# Patient Record
Sex: Female | Born: 2000 | Race: White | Hispanic: No | Marital: Single | Smoking: Never smoker
Health system: Southern US, Community
[De-identification: ages and names within clinical notes are randomized; demographics above are authoritative.]

## PROBLEM LIST (undated history)

## (undated) DIAGNOSIS — F429 Obsessive-compulsive disorder, unspecified: Secondary | ICD-10-CM

## (undated) DIAGNOSIS — F909 Attention-deficit hyperactivity disorder, unspecified type: Secondary | ICD-10-CM

## (undated) DIAGNOSIS — J45909 Unspecified asthma, uncomplicated: Secondary | ICD-10-CM

## (undated) DIAGNOSIS — M2480 Other specific joint derangements of unspecified joint, not elsewhere classified: Secondary | ICD-10-CM

## (undated) DIAGNOSIS — F5082 Avoidant/restrictive food intake disorder: Secondary | ICD-10-CM

## (undated) DIAGNOSIS — G901 Familial dysautonomia [Riley-Day]: Secondary | ICD-10-CM

## (undated) DIAGNOSIS — F419 Anxiety disorder, unspecified: Secondary | ICD-10-CM

## (undated) HISTORY — DX: Avoidant/restrictive food intake disorder: F50.82

## (undated) HISTORY — DX: Attention-deficit hyperactivity disorder, unspecified type: F90.9

## (undated) HISTORY — DX: Other specific joint derangements of unspecified joint, not elsewhere classified: M24.80

## (undated) HISTORY — DX: Unspecified asthma, uncomplicated: J45.909

## (undated) HISTORY — DX: Familial dysautonomia (riley-day): G90.1

## (undated) HISTORY — PX: WISDOM TOOTH EXTRACTION: SHX21

## (undated) HISTORY — DX: Anxiety disorder, unspecified: F41.9

## (undated) HISTORY — DX: Obsessive-compulsive disorder, unspecified: F42.9

---
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
## 2021-03-17 NOTE — Progress Notes (Deleted)
  [facility]  Telephone:([phone] [phone]) [phone]  ID: Sydney Reyes OB: March 01, 2001  MR#: [id number]  TJQ#:[id number]  Patient Care Team: Cyndia Diver, PA-C as PCP - General (Family Medicine)  CHIEF COMPLAINT: Elevated Ferritin  INTERVAL HISTORY: ***  REVIEW OF SYSTEMS:   ROS  As per HPI. Otherwise, a complete review of systems is negative.  PAST MEDICAL HISTORY: No past medical history on file.  PAST SURGICAL HISTORY: No past surgical history on file.  FAMILY HISTORY: No family history on file.  ADVANCED DIRECTIVES (Y/N):  N  HEALTH MAINTENANCE: Social History   Tobacco Use   Smoking status: Never  Substance Use Topics   Alcohol use: No   Drug use: No     Colonoscopy:  PAP:  Bone density:  Lipid panel:  No Known Allergies  No current outpatient medications on file.   No current facility-administered medications for this visit.    OBJECTIVE: There were no vitals filed for this visit.   There is no height or weight on file to calculate BMI.    ECOG FS:{CHL ONC [id number]  General: Well-developed, well-nourished, no acute distress. Eyes: Pink conjunctiva, anicteric sclera. HEENT: Normocephalic, moist mucous membranes. Lungs: No audible wheezing or coughing. Heart: Regular rate and rhythm. Abdomen: Soft, nontender, no obvious distention. Musculoskeletal: No edema, cyanosis, or clubbing. Neuro: Alert, answering all questions appropriately. Cranial nerves grossly intact. Skin: No rashes or petechiae noted. Psych: Normal affect. Lymphatics: No cervical, calvicular, axillary or inguinal LAD.   LAB RESULTS:  No results found for: NA, K, CL, CO2, GLUCOSE, BUN, CREATININE, CALCIUM, PROT, ALBUMIN, AST, ALT, ALKPHOS, BILITOT, GFRNONAA, GFRAA  No results found for: WBC, NEUTROABS, HGB, HCT, MCV, PLT   STUDIES: No results found.  ASSESSMENT:   PLAN:    Patient expressed understanding and was in agreement with this  plan. She also understands that She can call clinic at any time with any questions, concerns, or complaints.   Cancer Staging No matching staging information was found for the patient.  Mauro Kaufmann, NP   03/17/2021 8:54 PM

## 2021-03-18 ENCOUNTER — Telehealth: Payer: Self-pay | Admitting: Oncology

## 2021-03-18 ENCOUNTER — Inpatient Hospital Stay: Payer: Managed Care, Other (non HMO)

## 2021-03-18 ENCOUNTER — Inpatient Hospital Stay: Payer: Managed Care, Other (non HMO) | Admitting: Oncology

## 2021-03-18 NOTE — Telephone Encounter (Signed)
Nurse was looking over patient chart for New Hematology appt and noticed that the patient tested positive for Covid on 8/12. Call was placed to ask patient not to report to the clinic and to return the call to get her appt rescheduled. Note placed on Appt desk for not to check-in.

## 2021-04-30 ENCOUNTER — Ambulatory Visit: Payer: Managed Care, Other (non HMO) | Admitting: Cardiology

## 2021-04-30 ENCOUNTER — Other Ambulatory Visit: Payer: Self-pay

## 2021-04-30 ENCOUNTER — Encounter: Payer: Self-pay | Admitting: Cardiology

## 2021-04-30 VITALS — BP 127/84 | HR 128 | Resp 16 | Ht 62.0 in | Wt 110.0 lb

## 2021-04-30 DIAGNOSIS — F909 Attention-deficit hyperactivity disorder, unspecified type: Secondary | ICD-10-CM

## 2021-04-30 DIAGNOSIS — R000 Tachycardia, unspecified: Secondary | ICD-10-CM

## 2021-04-30 DIAGNOSIS — F419 Anxiety disorder, unspecified: Secondary | ICD-10-CM

## 2021-04-30 NOTE — Progress Notes (Signed)
Date:  04/30/2021   ID:  Sydney Reyes, DOB 11/30/2000, MRN [id number]  PCP:  Cyndia Diver, PA-C  Cardiologist:  Tessa Lerner, DO, [facility] (established care 04/30/2021)  REASON FOR CONSULT: Evaluation of POTS.   REQUESTING PHYSICIAN:  Tracey Harries, MD [address] [place],  [place] [address]  Chief Complaint  Patient presents with   postural orthostatic tachycardia syndrome   New Patient (Initial Visit)    HPI  Sydney Reyes is a 20 y.o. female who presents to the office with a chief complaint of " evaluation for POTS." Patient's past medical history and cardiovascular risk factors include: ADHD, insomnia, anxiety.  She is referred to the office at the request of Tracey Harries, MD for evaluation of POTS.  Patient is accompanied by her roommate Elmer.  Patient provides verbal consent with regards to discussing her medical information and disease management in the presence of her roommate.  Patient states that approximately 3 years ago or more she had a syncopal event and since then no reoccurrence.  However she does experience lightheaded and dizziness predominantly when changing position.  She also notices variable heart rate ranging from 40-180 bpm.  No unifying diagnoses.  She consumes approximately 32 ounces of water.  Tries to consume 2 or 3 meals per day.  Patient states that at times she does increase salt in the diet when she feels lightheaded and dizzy as per her dietitians recommendation.  She denies the use of cigarette smoking, marijuana, illicit drugs, energy drinks, weight loss supplements.  No known thyroid disease or anemia.  Patient is on stimulant medications for her ADHD.  She was on Adderall in the past which was stopped approximately 6 months ago according to her.  She is now on Viloxazine which was started last week.  She is also on trazodone for sleep and for anxiety she is currently on Pristiq and Xanax.  Patient denies a family  history of premature coronary artery disease, sudden cardiac death, or cardiomyopathy.  ALLERGIES: No Known Allergies  MEDICATION LIST PRIOR TO VISIT: Current Meds  Medication Sig   albuterol (VENTOLIN HFA) 108 (90 Base) MCG/ACT inhaler SMARTSIG:2 Puff(s) By Mouth Every 6 Hours PRN   ALPRAZolam (XANAX) 0.5 MG tablet Take 0.5 mg by mouth daily as needed.   cyanocobalamin 1000 MCG tablet Take by mouth.   desvenlafaxine (PRISTIQ) 50 MG 24 hr tablet Take by mouth.   loratadine (CLARITIN) 10 MG tablet Take by mouth.   magnesium 30 MG tablet Take by mouth.   Melatonin 10 MG TABS Take by mouth.   montelukast (SINGULAIR) 10 MG tablet Take 10 mg by mouth daily.   Multiple Vitamin (MULTIVITAMIN) tablet Take 1 tablet by mouth every morning.   norethindrone-ethinyl estradiol-FE (LOESTRIN FE) 1-20 MG-MCG tablet Take 1 tablet by mouth daily.   traZODone (DESYREL) 50 MG tablet Take 50-150 mg by mouth at bedtime.   Viloxazine HCl (QELBREE PO) Take 400 mg by mouth.     PAST MEDICAL HISTORY: Past Medical History:  Diagnosis Date   ADHD    Anxiety    Asthma    OCD (obsessive compulsive disorder)     PAST SURGICAL HISTORY: History reviewed. No pertinent surgical history.  FAMILY HISTORY: No family history of premature coronary disease or sudden cardiac death.  SOCIAL HISTORY:  The patient  reports that she has never smoked. She has never used smokeless tobacco. She reports that she does not drink alcohol and does not use drugs.  REVIEW  OF SYSTEMS: Review of Systems  Constitutional: Negative for chills and fever.  HENT:  Negative for hoarse voice and nosebleeds.   Eyes:  Negative for discharge, double vision and pain.  Cardiovascular:  Positive for palpitations. Negative for chest pain, claudication, dyspnea on exertion, leg swelling, near-syncope, orthopnea, paroxysmal nocturnal dyspnea and syncope.  Respiratory:  Negative for hemoptysis and shortness of breath.   Musculoskeletal:   Negative for muscle cramps and myalgias.  Gastrointestinal:  Negative for abdominal pain, constipation, diarrhea, hematemesis, hematochezia, melena, nausea and vomiting.  Neurological:  Positive for dizziness and light-headedness.   PHYSICAL EXAM: Vitals with BMI 04/30/2021 01/29/2015  Height 5\' 2"  -  Weight 110 lbs 79 lbs  BMI 20.11 -  Systolic 127 115  Diastolic 84 67  Pulse 128 100   Orthostatic VS for the past 72 hrs (Last 3 readings):  Orthostatic BP Patient Position BP Location Cuff Size Orthostatic Pulse  04/30/21 0932 110/69 Standing Left Arm Normal 142  04/30/21 0931 105/71 Sitting Left Arm Normal 146  04/30/21 0930 115/76 Supine Left Arm Normal 130     CONSTITUTIONAL: Well-developed and well-nourished. No acute distress.  SKIN: Skin is warm and dry. No rash noted. No cyanosis. No pallor. No jaundice HEAD: Normocephalic and atraumatic.  EYES: No scleral icterus MOUTH/THROAT: Moist oral membranes.  NECK: No JVD present. No thyromegaly noted. No carotid bruits  LYMPHATIC: No visible cervical adenopathy.  CHEST Normal respiratory effort. No intercostal retractions  LUNGS: Clear to auscultation bilaterally.  No stridor. No wheezes. No rales.  CARDIOVASCULAR: Regular, tachycardic, positive S1-S2, no murmurs rubs or gallops appreciated secondary to tachycardia ABDOMINAL: Nonobese, soft, nontender, nondistended, positive bowel sounds in all 4 quadrants, no apparent ascites.  EXTREMITIES: No peripheral edema  HEMATOLOGIC: No significant bruising NEUROLOGIC: Oriented to person, place, and time. Nonfocal. Normal muscle tone.  PSYCHIATRIC: Normal mood and affect. Normal behavior. Cooperative  CARDIAC DATABASE: EKG: 04/30/2021: Sinus tachycardia, 136 bpm, incomplete right bundle branch block, right axis deviation, without underlying injury pattern.   Echocardiogram: No results found for this or any previous visit from the past 1095 days.   Stress Testing: No results found for  this or any previous visit from the past 1095 days.  Heart Catheterization: None  LABORATORY DATA: No flowsheet data found.  No flowsheet data found.  Lipid Panel  No results found for: CHOL, TRIG, HDL, CHOLHDL, VLDL, LDLCALC, LDLDIRECT, LABVLDL  No components found for: NTPROBNP No results for input(s): PROBNP in the last 8760 hours. No results for input(s): TSH in the last 8760 hours.  BMP No results for input(s): NA, K, CL, CO2, GLUCOSE, BUN, CREATININE, CALCIUM, GFRNONAA, GFRAA in the last 8760 hours.  HEMOGLOBIN A1C No results found for: HGBA1C, MPG  IMPRESSION:    ICD-10-CM   1. Tachycardia  R00.0 EKG 12-Lead    Hemoglobin and hematocrit, blood    Basic metabolic panel    TSH    2. Attention deficit hyperactivity disorder (ADHD), unspecified ADHD type  F90.9     3. Anxiety  F41.9        RECOMMENDATIONS: Maguire Killmer is a 20 y.o. female whose past medical history and cardiac risk factors include: ADHD, insomnia, anxiety.  Patient presents today for evaluation of POTS.  Based on the vital signs performed patient is orthostatic vital signs are negative and the pulse changes is not suggestive of postural orthostatic tachycardia syndrome.  I suspect that the patient's underlying symptoms are secondary to her current medical therapy that she is  being prescribed given her ADHD, anxiety, and insomnia.  I have humbly requested the patient to follow-up with her psychiatrist to see if she can be on other medical therapy that will not predispose her to the symptoms as she is currently complaining having.  In the interim, requested that she consumes approximately 3 L of fluids to keep her self well-hydrated, increasing the salt on days when she has episodes of lightheaded and dizziness with changing positions, wearing compression stockings.   We will also order a TSH, hemoglobin hematocrit, and a BNP to rule out reversible causes.  Patient also brings in 7 pages of  self-reported symptoms, past work-up, and things that she has tried in the past.  This was reviewed as a part of today's encounter.  Based on the clinical information my clinical suspicion for POTS is low on the differential diagnosis but not completely ruled out.  However she is more than welcome to seek a second opinion given her symptoms with another cardiologist or electrophysiologist.  I strongly encouraged her to have her medications reevaluated by her psychiatrist to see if there are other options that could help address her chronic comorbid conditions but also reduce the side effect profile.  FINAL MEDICATION LIST END OF ENCOUNTER: No orders of the defined types were placed in this encounter.   There are no discontinued medications.   Current Outpatient Medications:    albuterol (VENTOLIN HFA) 108 (90 Base) MCG/ACT inhaler, SMARTSIG:2 Puff(s) By Mouth Every 6 Hours PRN, Disp: , Rfl:    ALPRAZolam (XANAX) 0.5 MG tablet, Take 0.5 mg by mouth daily as needed., Disp: , Rfl:    cyanocobalamin 1000 MCG tablet, Take by mouth., Disp: , Rfl:    desvenlafaxine (PRISTIQ) 50 MG 24 hr tablet, Take by mouth., Disp: , Rfl:    loratadine (CLARITIN) 10 MG tablet, Take by mouth., Disp: , Rfl:    magnesium 30 MG tablet, Take by mouth., Disp: , Rfl:    Melatonin 10 MG TABS, Take by mouth., Disp: , Rfl:    montelukast (SINGULAIR) 10 MG tablet, Take 10 mg by mouth daily., Disp: , Rfl:    Multiple Vitamin (MULTIVITAMIN) tablet, Take 1 tablet by mouth every morning., Disp: , Rfl:    norethindrone-ethinyl estradiol-FE (LOESTRIN FE) 1-20 MG-MCG tablet, Take 1 tablet by mouth daily., Disp: , Rfl:    traZODone (DESYREL) 50 MG tablet, Take 50-150 mg by mouth at bedtime., Disp: , Rfl:    Viloxazine HCl (QELBREE PO), Take 400 mg by mouth., Disp: , Rfl:   Orders Placed This Encounter  Procedures   Hemoglobin and hematocrit, blood   Basic metabolic panel   TSH   EKG 12-Lead    There are no Patient Instructions  on file for this visit.   --Continue cardiac medications as reconciled in final medication list. --Return if symptoms worsen or fail to improve. Or sooner if needed. --Continue follow-up with your primary care physician regarding the management of your other chronic comorbid conditions.  Patient's questions and concerns were addressed to her satisfaction. She voices understanding of the instructions provided during this encounter.   This note was created using a voice recognition software as a result there may be grammatical errors inadvertently enclosed that do not reflect the nature of this encounter. Every attempt is made to correct such errors.  Tessa Lerner, [place], [facility]  Pager: [phone] Office: [phone]

## 2021-05-01 ENCOUNTER — Encounter: Payer: Self-pay | Admitting: Internal Medicine

## 2021-05-01 ENCOUNTER — Ambulatory Visit: Payer: Managed Care, Other (non HMO) | Admitting: Internal Medicine

## 2021-05-01 ENCOUNTER — Ambulatory Visit (INDEPENDENT_AMBULATORY_CARE_PROVIDER_SITE_OTHER): Payer: Managed Care, Other (non HMO)

## 2021-05-01 VITALS — BP 114/72 | HR 119 | Ht 62.0 in | Wt 111.0 lb

## 2021-05-01 DIAGNOSIS — R42 Dizziness and giddiness: Secondary | ICD-10-CM | POA: Diagnosis not present

## 2021-05-01 DIAGNOSIS — R000 Tachycardia, unspecified: Secondary | ICD-10-CM | POA: Diagnosis not present

## 2021-05-01 DIAGNOSIS — R002 Palpitations: Secondary | ICD-10-CM

## 2021-05-01 DIAGNOSIS — R55 Syncope and collapse: Secondary | ICD-10-CM

## 2021-05-01 MED ORDER — SODIUM CHLORIDE 1 G PO TABS: 1.0000 g | ORAL_TABLET | Freq: Three times a day (TID) | ORAL | Status: DC

## 2021-05-01 NOTE — Patient Instructions (Addendum)
Medication Instructions:   Your physician has recommended you make the following change in your medication:   START Salt tablets 1 gram three times daily  *If you need a refill on your cardiac medications before your next appointment, please call your pharmacy*   Lab Work:  None ordered  Testing/Procedures:  1) Your physician has requested that you have an echocardiogram scheduled AFTER wearing event monitor for TWO WEEKS. Echocardiography is a painless test that uses sound waves to create images of your heart. It provides your doctor with information about the size and shape of your heart and how well your heart's chambers and valves are working. This procedure takes approximately one hour. There are no restrictions for this procedure.  2) Your physician has recommended that you wear a Zio XT monitor for TWO WEEKS.   This monitor is a medical device that records the heart's electrical activity. Doctors most often use these monitors to diagnose arrhythmias. Arrhythmias are problems with the speed or rhythm of the heartbeat. The monitor is a small device applied to your chest. You can wear one while you do your normal daily activities. While wearing this monitor if you have any symptoms to push the button and record what you felt. Once you have worn this monitor for the period of time provider prescribed (Usually 14 days), you will return the monitor device in the postage paid box. Once it is returned they will download the data collected and provide [place] with a report which the provider will then review and we will call you with those results. Important tips:  Avoid showering during the first 24 hours of wearing the monitor. Avoid excessive sweating to help maximize wear time. Do not submerge the device, no hot tubs, and no swimming pools. Keep any lotions or oils away from the patch. After 24 hours you may shower with the patch on. Take brief showers with your back facing the shower head.   Do not remove patch once it has been placed because that will interrupt data and decrease adhesive wear time. Push the button when you have any symptoms and write down what you were feeling. Once you have completed wearing your monitor, remove and place into box which has postage paid and place in your outgoing mailbox.  If for some reason you have misplaced your box then call our office and we can provide another box and/or mail it off for you.     Follow-Up: At [facility], you and your health needs are our priority.  As part of our continuing mission to provide you with exceptional heart care, we have created designated Provider Care Teams.  These Care Teams include your primary Cardiologist (physician) and Advanced Practice Providers (APPs -  Physician Assistants and Nurse Practitioners) who all work together to provide you with the care you need, when you need it.  We recommend signing up for the patient portal called "MyChart".  Sign up information is provided on this After Visit Summary.  MyChart is used to connect with patients for Virtual Visits (Telemedicine).  Patients are able to view lab/test results, encounter notes, upcoming appointments, etc.  Non-urgent messages can be sent to your provider as well.   To learn more about what you can do with MyChart, go to [email or web address].    Your next appointment:   6 week(s)  The format for your next appointment:   In Person  Provider:   You may see Dr. Cristal Deer End or one of the following  Advanced Practice Providers on your designated Care Team:   Nicolasa Ducking, NP Eula Listen, PA-C Marisue Ivan, PA-C [place], [place]

## 2021-05-01 NOTE — Progress Notes (Signed)
New Outpatient Visit Date: 05/01/2021  Referring Provider: Cyndia Diver, PA-C [address] [place],  [place] [address]  Chief Complaint: Lightheadedness and elevated heart rates with concern for POTS  HPI:  Ms. Vanengen is a 20 y.o. female who is being seen today for second opinion regarding tachycardia and possible postural orthostatic tachycardia syndrome. She has a history of ADHD, anxiety, and insomnia.  She was seen yesterday in [facility] by Dr. Odis Hollingshead [facility]), where she was noted to be tachycardic.  Orthostatic vital signs were not suggestive of POTS.  It was felt that her symptoms may be related to medications prescribed for management of her ADHD, anxiety, and insomnia.  TSH, hemoglobin, and BNP were ordered yesterday.  Ms. Cleere was encouraged to stay well-hydrated, increase her sodium intake, use compression devices, and speak with her mental health provider about medication changes to help alleviate her symptoms.  Today, Ms. Dyk reports that she has had episodes of lightheadedness and elevated heart rates dating back to elementary school.  She describes episodes of "white outs" that often happen when she stands up though they occasion will also occur when she is seated.  She feels very lightheaded whitening of her vision that can last up to 15 minutes.  It typically improves when she sits or lies down and also when she drinks cold water.  She is only passed out once, which occurred several years ago when she had been standing in church with her grandfather.  She fell to the ground but was only unconscious for a few seconds.  These episodes are often associated with a racing heartbeat, though she also feels similar palpitations at other times when she is not lightheaded.  She notes shortness of breath with modest activity such as going up a few steps.  This has also been longstanding.  She has been told in the past that some of her symptoms may be related to her ADHD  and anxiety medications, though she notes that many of her symptoms predate initiation of these medications in high school.  She does not drink much caffeine and has been trying to consume lots of water as well as additional sodium.  Ms. Hautala denies a history of prior cardiac testing.  She has experienced longstanding joint pains initially in her knees and now involving her hands as well that prompted extensive rheumatologic work-up without obvious cause identified.  In regard to her ADHD and anxiety, she currently uses does venlafaxine and viloxazine, though she will likely come off the latter due to prohibitive cost.  She has also been prescribed alprazolam but does not use this on a frequent basis.  Ms. Biswas also reports that she is due to have her wisdom teeth removed in the next few weeks and inquires about the safety of sedation/anesthesia in the setting of her tachycardia and lightheadedness.  --------------------------------------------------------------------------------------------------  Cardiovascular History & Procedures: Cardiovascular Problems: Orthostatic lightheadedness/near syncope Tachycardia  Risk Factors: None  Cath/PCI: None  CV Surgery: None  EP Procedures and Devices: None  Non-Invasive Evaluation(s): None  --------------------------------------------------------------------------------------------------  Past Medical History:  Diagnosis Date   ADHD    Anxiety    Asthma    OCD (obsessive compulsive disorder)     History reviewed. No pertinent surgical history.  Current Meds  Medication Sig   albuterol (VENTOLIN HFA) 108 (90 Base) MCG/ACT inhaler SMARTSIG:2 Puff(s) By Mouth Every 6 Hours PRN   ALPRAZolam (XANAX) 0.5 MG tablet Take 0.5 mg by mouth daily as needed.  cyanocobalamin 1000 MCG tablet Take 1,000 mcg by mouth daily.   desvenlafaxine (PRISTIQ) 50 MG 24 hr tablet Take 50 mg by mouth daily.   loratadine (CLARITIN) 10 MG tablet Take 10 mg by  mouth daily.   magnesium 30 MG tablet Take 30 mg by mouth daily.   Melatonin 10 MG TABS Take 1 tablet by mouth at bedtime as needed.   montelukast (SINGULAIR) 10 MG tablet Take 10 mg by mouth daily.   Multiple Vitamin (MULTIVITAMIN) tablet Take 1 tablet by mouth every morning.   norethindrone-ethinyl estradiol-FE (LOESTRIN FE) 1-20 MG-MCG tablet Take 1 tablet by mouth daily.   sodium chloride 1 g tablet Take 1 tablet (1 g total) by mouth 3 (three) times daily. Pt states she picked up over the counter   traZODone (DESYREL) 50 MG tablet Take 50-150 mg by mouth at bedtime.   Viloxazine HCl (QELBREE PO) Take 400 mg by mouth daily.    Allergies: Prednisone and Gluten meal  Social History   Tobacco Use   Smoking status: Never   Smokeless tobacco: Never  Vaping Use   Vaping Use: Never used  Substance Use Topics   Alcohol use: No   Drug use: No    Family History  Problem Relation Age of Onset   Ovarian cysts Maternal Grandmother    Stroke Paternal Grandfather    Dementia Paternal Great-grandfather    Cancer Paternal Great-grandfather    Breast cancer Maternal Great-grandfather    Heart attack Maternal Great-grandfather    Sudden Cardiac Death Neg Hx     Review of Systems: Ms. Grange reports gluten intolerance without a diagnosis of celiac disease.  Otherwise, a 12-system review of systems was performed and was negative except as noted in the HPI.  --------------------------------------------------------------------------------------------------  Physical Exam: BP 114/72 (BP Location: Right Arm, Patient Position: Sitting, Cuff Size: Normal)   Pulse (!) 119   Ht 5\' 2"  (1.575 m)   Wt 111 lb (50.3 kg)   SpO2 99%   BMI 20.30 kg/m  Position Blood pressure (mmHg) Heart rate (bpm)  Lying 112/75 128  Sitting 97/67 133  Standing 110/64 150  Standing (3 minutes) 107/79 148     General: NAD.  Accompanied by friend. HEENT: No conjunctival pallor or scleral icterus. Facemask in  place. Neck: Supple without lymphadenopathy, thyromegaly, JVD, or HJR. No carotid bruit. Lungs: Normal work of breathing. Clear to auscultation bilaterally without wheezes or crackles. Heart: Tachycardic but regular without murmurs, rubs, or gallops. Non-displaced PMI. Abd: Bowel sounds present. Soft, NT/ND without hepatosplenomegaly Ext: No lower extremity edema. Radial, PT, and DP pulses are 2+ bilaterally Skin: Warm and dry without rash. Neuro: CNIII-XII intact. Strength and fine-touch sensation intact in upper and lower extremities bilaterally. Psych: Normal mood and affect.  EKG: Sinus tachycardia with borderline left atrial enlargement and incomplete right bundle branch block.  Outside labs:  CMP (02/15/2021): Sodium 142, potassium 4.7, chloride 105, CO2 25, BUN 8, creatinine 0.7, glucose 96, calcium 9.7, AST 13, ALT 14, alkaline phosphatase 43, total bilirubin 0.3, total protein 7.1, albumin 4.7  CBC (02/15/2021): WBC 8.1, Hgb 13.7, HCT 42, PLT 294  TSH (10/30/2020): 1.64  --------------------------------------------------------------------------------------------------  ASSESSMENT AND PLAN: Sinus tachycardia, palpitations, and orthostatic lightheadedness with near-syncope: Ms. Lannan reports a long history of elevated/labile heart rates as well as lightheadedness, including one syncopal episode a few years ago.  She is concerned about the potential for POTS.  Her physical examination today is notable only for sinus tachycardia.  Orthostatic vital  signs did not exhibit a significant blood pressure drop though her heart rate rose 22 bpm from lying to standing.  We discussed that structural heart abnormalities (though unlikely), arrhythmias, and other external factors may be contributing to her symptoms and need to be excluded before a diagnosis of POTS can be established.  Specifically, some of her psychotropic medications can cause tachycardia and lightheadedness.  I have encouraged her to  speak with her PCP and/or mental health provider to see if any of these medications can be decreased or stopped.  We will also arrange for a transthoracic echocardiogram and 14-day event monitor.  I have encouraged her to wear tight fitting clothing to offer venous compression as well as to stay well-hydrated with water.  She should continue to avoid caffeine.  As she finds it difficult to eat a lot of salty foods on a regular basis, we will try sodium supplementation with sodium chloride 1 gm three times daily.  If symptoms persist despite reassuring work-up and withdrawal of potentially offending medications, referral to a POTS specialist may be useful.  Preoperative cardiovascular risk assessment for wisdom teeth extraction: In general, this is a low risk procedure from a cardiac standpoint.  Ms. Amparo has a long history of tachycardia and lightheadedness, which seems to be stable.  I think it is reasonable for her to proceed with her wisdom tooth extraction as we continue aforementioned cardiac work-up.  If possible, I recommend minimizing use of epinephrine as part of local anesthesia, as this could exacerbate tachycardia.  Follow-up: Return to clinic in 6 weeks.  [name], MD 05/02/2021 8:56 AM

## 2021-05-02 ENCOUNTER — Encounter: Payer: Self-pay | Admitting: Internal Medicine

## 2021-05-02 DIAGNOSIS — R55 Syncope and collapse: Secondary | ICD-10-CM | POA: Insufficient documentation

## 2021-05-02 DIAGNOSIS — R42 Dizziness and giddiness: Secondary | ICD-10-CM | POA: Insufficient documentation

## 2021-05-02 DIAGNOSIS — R000 Tachycardia, unspecified: Secondary | ICD-10-CM | POA: Insufficient documentation

## 2021-05-02 DIAGNOSIS — R002 Palpitations: Secondary | ICD-10-CM | POA: Insufficient documentation

## 2021-05-08 ENCOUNTER — Telehealth: Payer: Self-pay | Admitting: Internal Medicine

## 2021-05-08 NOTE — Telephone Encounter (Signed)
Refaxed clearance to [facility] @ [phone].  Spoke with office and they did verbally confirm that fax was received.  Pt has been notified.

## 2021-05-08 NOTE — Telephone Encounter (Signed)
Patient calling States she is having her wisdom teeth taken out tomorrow and at last visit she gave Dr End a form to be completed and faxed to dentist saying it is ok for her to be put under Dentist has not received  Please call to discuss

## 2021-05-22 ENCOUNTER — Telehealth: Payer: Self-pay | Admitting: *Deleted

## 2021-05-22 NOTE — Telephone Encounter (Signed)
Patient returned call

## 2021-05-22 NOTE — Telephone Encounter (Signed)
Spoke to pt. Notified of event monitor results and Dr. Serita Kyle recc.  Pt voiced understanding.   Pt is scheduled for echo 05/30/21.  States she is concerned about insurance coverage and if able to afford procedure.  Pt states she reviewed cost of echo on MyChart and would like to know how accurate this is.   Advised pt that I can ask our precert team to answer questions regarding insurance coverage of echo.

## 2021-05-22 NOTE — Telephone Encounter (Signed)
Attempted to call pt. No answer. Lmtcb.  

## 2021-05-22 NOTE — Telephone Encounter (Signed)
-----   Message from [name], MD sent at 05/22/2021  9:07 AM EDT ----- Please let Sydney Reyes know that her event monitor shows a few extra beats and a higher average heart rate than normal.  I recommend that we proceed with echocardiogram as scheduled for next week.  In the meantime, she should continue to work on staying well-hydrated.

## 2021-05-23 NOTE — Telephone Encounter (Signed)
Returned patient's call regarding OOP amount for echo.  Advised that it would be applied to her deductible.  Also asked that if she chose not to have test to please call and cancel.

## 2021-05-28 ENCOUNTER — Telehealth: Payer: Self-pay | Admitting: Internal Medicine

## 2021-05-28 NOTE — Telephone Encounter (Signed)
Fyi patient declined echo due to cost and wants to keep fu visit to discuss zio.

## 2021-05-30 ENCOUNTER — Other Ambulatory Visit: Payer: Managed Care, Other (non HMO)

## 2021-06-11 ENCOUNTER — Ambulatory Visit: Payer: Managed Care, Other (non HMO) | Admitting: Cardiology

## 2021-06-13 ENCOUNTER — Ambulatory Visit: Payer: Managed Care, Other (non HMO) | Admitting: Internal Medicine

## 2021-06-13 ENCOUNTER — Other Ambulatory Visit: Payer: Self-pay

## 2021-06-13 ENCOUNTER — Encounter: Payer: Self-pay | Admitting: Internal Medicine

## 2021-06-13 VITALS — BP 94/80 | HR 135 | Ht 62.0 in | Wt 112.0 lb

## 2021-06-13 DIAGNOSIS — R002 Palpitations: Secondary | ICD-10-CM | POA: Diagnosis not present

## 2021-06-13 DIAGNOSIS — R42 Dizziness and giddiness: Secondary | ICD-10-CM

## 2021-06-13 DIAGNOSIS — R000 Tachycardia, unspecified: Secondary | ICD-10-CM | POA: Diagnosis not present

## 2021-06-13 MED ORDER — METOPROLOL TARTRATE 25 MG PO TABS: 12.5000 mg | ORAL_TABLET | Freq: Two times a day (BID) | ORAL | 2 refills | Status: DC

## 2021-06-13 NOTE — Progress Notes (Signed)
Follow-up Outpatient Visit Date: 06/13/2021  Primary Care Provider: Dionicia Abler, PA-C [place] [address]  Chief Complaint: Follow-up palpitations and lightheadedness  HPI:  [name] is a 20 y.o. female with history of ADHD, anxiety, and insomnia, who presents for follow-up of tachycardia and lightheadedness.  I met her in early October, at which time she reported lightheadedness and elevated heart rates dating back to childhood.  She was concerned about the possibility of POTS.  Event monitor showed predominantly sinus rhythm with rare PACs and PVCs as well as 2 brief episodes of PSVT.  Average heart rate was elevated at 107 bpm.  Echocardiogram was ordered but has not been performed yet due to high out-of-pocket expense.  Ms. Munce hopes to proceed with echocardiogram in the new year.  Overall, Ms. Shellhammer feels similar to our last visit.  She still has elevated heart rates and feels little lightheaded especially when standing.  She is drinking at least 80 ounces of water a day.  She notes that she feels even more lightheaded if she skips the salt tablets that were recommended at our last visit.  She has been trying to wear tight garments to help provide venous support.  She has not passed out.  She also denies chest pain and shortness of breath.  --------------------------------------------------------------------------------------------------  Cardiovascular History & Procedures: Cardiovascular Problems: Orthostatic lightheadedness/near syncope Tachycardia   Risk Factors: None   Cath/PCI: None   CV Surgery: None   EP Procedures and Devices: 14-day event monitor (05/21/2021): Predominantly sinus rhythm with rare PAC's and PVC's, as well as two brief episodes of PSVT.  Elevated average heart rate noted.   Non-Invasive Evaluation(s): None  Past medical and surgical history were reviewed and updated in EPIC.  Current Meds  Medication Sig   albuterol  (VENTOLIN HFA) 108 (90 Base) MCG/ACT inhaler SMARTSIG:2 Puff(s) By Mouth Every 6 Hours PRN   ALPRAZolam (XANAX) 0.5 MG tablet Take 0.5 mg by mouth daily as needed.   cyanocobalamin 1000 MCG tablet Take 1,000 mcg by mouth daily.   desvenlafaxine (PRISTIQ) 50 MG 24 hr tablet Take 50 mg by mouth daily.   loratadine (CLARITIN) 10 MG tablet Take 10 mg by mouth daily.   magnesium 30 MG tablet Take 30 mg by mouth daily.   Melatonin 10 MG TABS Take 1 tablet by mouth at bedtime as needed.   montelukast (SINGULAIR) 10 MG tablet Take 10 mg by mouth daily.   Multiple Vitamin (MULTIVITAMIN) tablet Take 1 tablet by mouth every morning.   norethindrone-ethinyl estradiol-FE (LOESTRIN FE) 1-20 MG-MCG tablet Take 1 tablet by mouth daily.   sodium chloride 1 g tablet Take 1 tablet (1 g total) by mouth 3 (three) times daily. Pt states she picked up over the counter   traZODone (DESYREL) 50 MG tablet Take 50-150 mg by mouth at bedtime.   Viloxazine HCl (QELBREE PO) Take 400 mg by mouth daily.   [DISCONTINUED] metoprolol tartrate (LOPRESSOR) 25 MG tablet Take 0.5 tablets (12.5 mg total) by mouth 2 (two) times daily.    Allergies: Prednisone and Gluten meal  Social History   Tobacco Use   Smoking status: Never   Smokeless tobacco: Never  Vaping Use   Vaping Use: Never used  Substance Use Topics   Alcohol use: No   Drug use: No    Family History  Problem Relation Age of Onset   Ovarian cysts Maternal Grandmother    Stroke Paternal Grandfather    Dementia Paternal  Great-grandfather    Cancer Paternal Great-grandfather    Breast cancer Maternal Great-grandfather    Heart attack Maternal Great-grandfather    Sudden Cardiac Death Neg Hx     Review of Systems: A 12-system review of systems was performed and was negative except as noted in the HPI.  --------------------------------------------------------------------------------------------------  Physical Exam: BP 94/80 (BP Location: Left Arm,  Patient Position: Sitting, Cuff Size: Normal)   Pulse (!) 135   Ht 5' 2"  (1.575 m)   Wt 112 lb (50.8 kg)   SpO2 99%   BMI 20.49 kg/m   General:  NAD. Neck: No JVD or HJR. Lungs: Clear to auscultation bilaterally without wheezes or crackles. Heart: Tachycardic but regular without murmurs, rubs, or gallops. Abdomen: Soft, nontender, nondistended. Extremities: No lower extremity edema.  EKG: Sinus tachycardia with right axis deviation and rSR' in V1/V2.  Outside labs:   CMP (02/15/2021): Sodium 142, potassium 4.7, chloride 105, CO2 25, BUN 8, creatinine 0.7, glucose 96, calcium 9.7, AST 13, ALT 14, alkaline phosphatase 43, total bilirubin 0.3, total protein 7.1, albumin 4.7   CBC (02/15/2021): WBC 8.1, Hgb 13.7, HCT 42, PLT 294   TSH (10/30/2020): 1.64  --------------------------------------------------------------------------------------------------  ASSESSMENT AND PLAN: Tachycardia, palpitations, and lightheadedness: Symptoms are stable to minimally better than at our last visit.  Ms. Ley has been trying to stay well-hydrated and wear compressive garments.  She is also utilizing salt tablets.  Event monitor showed elevated average heart rates without significant arrhythmia to explain symptoms.  Echocardiogram has yet to be performed due to high out-of-pocket expense.  We have agreed to a trial of metoprolol tartrate 12.5 mg twice daily.  We will also refer Ms. Elnoria Howard to Dr. Caryl Comes for further evaluation and management of possible POTS.  Hopefully, echocardiogram can be completed before she sees Dr. Caryl Comes in order to exclude structural abnormalities.  Follow-up: Return to see me as needed.  Nelva Bush, MD 06/13/2021 1:58 PM

## 2021-06-13 NOTE — Patient Instructions (Signed)
Medication Instructions:   Your physician has recommended you make the following change in your medication:   START Metoprolol Tartrate 12.5 TWICE daily   *If you need a refill on your cardiac medications before your next appointment, please call your pharmacy*   Lab Work:  None ordered  Testing/Procedures:  None ordered   Follow-Up: At [facility], you and your health needs are our priority.  As part of our continuing mission to provide you with exceptional heart care, we have created designated Provider Care Teams.  These Care Teams include your primary Cardiologist (physician) and Advanced Practice Providers (APPs -  Physician Assistants and Nurse Practitioners) who all work together to provide you with the care you need, when you need it.  We recommend signing up for the patient portal called "MyChart".  Sign up information is provided on this After Visit Summary.  MyChart is used to connect with patients for Virtual Visits (Telemedicine).  Patients are able to view lab/test results, encounter notes, upcoming appointments, etc.  Non-urgent messages can be sent to your provider as well.   To learn more about what you can do with MyChart, go to [email or web address].    Your next appointment:    You have been referred to Dr. Graciela Husbands with EP (Electrophysiology)  Follow up with Dr. Okey Dupre as needed  The format for your next appointment:   In Person

## 2021-06-26 NOTE — Progress Notes (Signed)
ELECTROPHYSIOLOGY CONSULT NOTE  Patient ID: Sydney Reyes, MRN: [id number], DOB/AGE: [birth year] 20 y.o. Admit date: (Not on file) Date of Consult: 06/27/2021  Primary Physician: Cyndia Diver, PA-C Primary Cardiologist: CE     Sydney Reyes is a 20 y.o. female who is being seen today for the evaluation of tachycardia at the request of DrCE.    HPI Sydney Reyes is a 20 y.o. female referred because of concerns regarding POTS   She has a greater than 10-year history of orthostatic intolerance manifested by lightheadedness, presyncope and syncope, associated with tunnel vision and ear ringing and relieved by sitting.  Has had exercise intolerance associated with tachypalpitations and shortness of breath as long as she can remember.  She has not fit.  She has shower intolerance associated with lightheadedness. **+/- itching**  Her menses have been suppressed because of menorrhagia for the last 6 years.  Heat intolerance  Rare syncope.  Significant presyncope twice per year and lightheadedness sufficient to cause her to sit down about twice per month.  Her diet is salt deplete but fluid replete.  Struggles with sleep with chronic insomnia.  Brain fog.  Seeing multiple physicians over the years.  More recently the question of POTS has been raised (Dr. CE and others) and she is starting to try to improve sodium intake (takes irregularly) which has helped a little bit when she takes it  Chronic GI symptoms, recently better following a diagnosis of gluten intolerance  Longstanding joint symptoms joints clicking, not notably joint lacks.  Does not use alcohol or marijuana  Event recorder was personally reviewed.  Nocturnal heart rates average in the 70s and 80s daytime averages are above 100.  1 episode of nonsustained atrial tachycardia noted on the monitor  Dismisses anxiety and depression " when you have lived your whole life this way."   Date Cr K Hgb TSH  10/22 0.7 4.5  14.9 1.354             Past Medical History:  Diagnosis Date   ADHD    Anxiety    Asthma    OCD (obsessive compulsive disorder)       Surgical History:  Past Surgical History:  Procedure Laterality Date   WISDOM TOOTH EXTRACTION       Home Meds: Current Meds  Medication Sig   albuterol (VENTOLIN HFA) 108 (90 Base) MCG/ACT inhaler SMARTSIG:2 Puff(s) By Mouth Every 6 Hours PRN   ALPRAZolam (XANAX) 0.5 MG tablet Take 0.5 mg by mouth daily as needed.   cyanocobalamin 1000 MCG tablet Take 1,000 mcg by mouth daily.   escitalopram (LEXAPRO) 10 MG tablet Take 10 mg by mouth daily.   loratadine (CLARITIN) 10 MG tablet Take 10 mg by mouth daily.   magnesium 30 MG tablet Take 30 mg by mouth daily.   Melatonin 10 MG TABS Take 1 tablet by mouth at bedtime as needed.   montelukast (SINGULAIR) 10 MG tablet Take 10 mg by mouth daily.   Multiple Vitamin (MULTIVITAMIN) tablet Take 1 tablet by mouth every morning.   norethindrone-ethinyl estradiol-FE (LOESTRIN FE) 1-20 MG-MCG tablet Take 1 tablet by mouth daily.   sodium chloride 1 g tablet Take 1 tablet (1 g total) by mouth 3 (three) times daily. Pt states she picked up over the counter   traZODone (DESYREL) 50 MG tablet Take 50-150 mg by mouth at bedtime.    Allergies:  Allergies  Allergen Reactions   Prednisone Other (See Comments)  Dizziness/fainting   Gluten Meal     Social History   Socioeconomic History   Marital status: Single    Spouse name: Not on file   Number of children: Not on file   Years of education: Not on file   Highest education level: Not on file  Occupational History   Not on file  Tobacco Use   Smoking status: Never   Smokeless tobacco: Never  Vaping Use   Vaping Use: Never used  Substance and Sexual Activity   Alcohol use: No   Drug use: No   Sexual activity: Not on file  Other Topics Concern   Not on file  Social History Narrative   ** Merged History Encounter **       Social Determinants  of Health   Financial Resource Strain: Not on file  Food Insecurity: Not on file  Transportation Needs: Not on file  Physical Activity: Not on file  Stress: Not on file  Social Connections: Not on file  Intimate Partner Violence: Not on file     Family History  Problem Relation Age of Onset   Ovarian cysts Maternal Grandmother    Stroke Paternal Grandfather    Dementia Paternal Great-grandfather    Cancer Paternal Great-grandfather    Breast cancer Maternal Great-grandfather    Heart attack Maternal Great-grandfather    Sudden Cardiac Death Neg Hx      ROS:  Please see the history of present illness.     All other systems reviewed and negative.    Physical Exam: Blood pressure 125/82, pulse (!) 101, height 5\' 2"  (1.575 m), weight 114 lb (51.7 kg), SpO2 99 %. General: Well developed, well nourished female in no acute distress. Head: Normocephalic, atraumatic, sclera non-icteric, no xanthomas, nares are without discharge. EENT: normal  Lymph Nodes:  none Neck: Negative for carotid bruits. JVD not elevated. Back:without scoliosis kyphosis Lungs: Clear bilaterally to auscultation without wheezes, rales, or rhonchi. Breathing is unlabored. Heart: RRR with S1 S2. no murmur . No rubs, or gallops appreciated. Abdomen: Soft, non-tender, non-distended with normoactive bowel sounds. No hepatomegaly. No rebound/guarding. No obvious abdominal masses. Msk:  Strength and tone appear normal for age. Extremities: No clubbing or cyanosis. No  edema.  Distal pedal pulses are 2+ and equal bilaterally.  Joint hypermobility with a positive thumb sign and a wrist sign Skin: Warm and Dry Neuro: Alert and oriented X 3. CN III-XII intact Grossly normal sensory and motor function . Psych:  Responds to questions appropriately with a normal affect.        EKG: sinus @ 101 120/09/33   Assessment and Plan:  Dysautonomia with manifestations of orthostatic tachycardia and inappropriate sinus  tachycardia  Joint hypermobility  Anxiety  Manifest dysautonomia with orthostatic intolerance, exercise intolerance brain fog heat intolerance. We discussed extensively the issues of dysautonomia, the physiology of orthstasis and positional stress.  We discussed the role of salt and water repletion, the importance of exercise, often needing to be started in the recumbent position, and the awareness of triggers and the role of ambient heat and dehydration  Recommended salt supplementation.  The goal is about 2 g of sodium, not sodium chloride, a day.  Salt supplements include oral ThermaTabs, buffered sodium preparation, SaltStick Vitassium,  Other options include NUUN.  This comes in pill form and is dissolved in liquids.  Other liquid preparations include liquid IV, Pedialyte advance care, TRI-oral Also discussed the role of compression wear including thigh sleeves and abdominal binders.  Calf compression is not specifically recommended..  Would hold off on Rx BB for now until volume status is optimized  Discussed the importance of "soul care" and the grieving of losses assoc with her chronic illness  86 min was spent in care of the patient including the review of records  1133-1259 pm    Sherryl Manges

## 2021-06-27 ENCOUNTER — Encounter: Payer: Self-pay | Admitting: Internal Medicine

## 2021-06-27 ENCOUNTER — Other Ambulatory Visit: Payer: Self-pay

## 2021-06-27 ENCOUNTER — Telehealth: Payer: Self-pay | Admitting: Internal Medicine

## 2021-06-27 ENCOUNTER — Ambulatory Visit: Payer: Managed Care, Other (non HMO) | Admitting: Internal Medicine

## 2021-06-27 VITALS — BP 125/82 | HR 101 | Ht 62.0 in | Wt 114.0 lb

## 2021-06-27 DIAGNOSIS — R002 Palpitations: Secondary | ICD-10-CM

## 2021-06-27 DIAGNOSIS — R42 Dizziness and giddiness: Secondary | ICD-10-CM | POA: Diagnosis not present

## 2021-06-27 DIAGNOSIS — R000 Tachycardia, unspecified: Secondary | ICD-10-CM

## 2021-06-27 NOTE — Patient Instructions (Addendum)
Medication Instructions:  - Your physician recommends that you continue on your current medications as directed. Please refer to the Current Medication list given to you today.  *If you need a refill on your cardiac medications before your next appointment, please call your pharmacy*   Lab Work: - none ordered  If you have labs (blood work) drawn today and your tests are completely normal, you will receive your results only by: MyChart Message (if you have MyChart) OR A paper copy in the mail If you have any lab test that is abnormal or we need to change your treatment, we will call you to review the results.   Testing/Procedures: - none ordered   Follow-Up: At [facility], you and your health needs are our priority.  As part of our continuing mission to provide you with exceptional heart care, we have created designated Provider Care Teams.  These Care Teams include your primary Cardiologist (physician) and Advanced Practice Providers (APPs -  Physician Assistants and Nurse Practitioners) who all work together to provide you with the care you need, when you need it.  We recommend signing up for the patient portal called "MyChart".  Sign up information is provided on this After Visit Summary.  MyChart is used to connect with patients for Virtual Visits (Telemedicine).  Patients are able to view lab/test results, encounter notes, upcoming appointments, etc.  Non-urgent messages can be sent to your provider as well.   To learn more about what you can do with MyChart, go to [email or web address].    Your next appointment:   Late January/ early February 2023   The format for your next appointment:   In Person  Provider:   Sherryl Manges, MD    Other Instructions Recommended salt supplementation.  The goal is about 2 g of sodium, not sodium chloride, a day.  Salt supplements include oral ThermaTabs, buffered sodium preparation, SaltStick Vitassium,  Other options include NUUN.  This  comes in pill form and is dissolved in liquids.  Other liquid preparations include liquid IV, Pedialyte advance care, TRI-oral Also discussed the role of compression wear including thigh sleeves and abdominal binders.  Calf compression is not specifically recommended.Marland Kitchen

## 2021-06-27 NOTE — Telephone Encounter (Signed)
Patient returning call from Dr. Graciela Husbands .   Patient reports Yes she does get itchy when showering .

## 2021-06-27 NOTE — Telephone Encounter (Signed)
To Dr. Klein to review. 

## 2021-07-02 NOTE — Telephone Encounter (Signed)
Duke Salvia, MD [phone])  Sent: Sheral Flow July 01, 2021  5:29 PM  To: Jefferey Pica, RN          Message  H  good am    Can we order urine for  N-methyl histamine, prostaglandin D2 or 11-beta- prostaglandin F2 alpha, leukotriene E4   Thanks SK

## 2021-07-05 NOTE — Telephone Encounter (Signed)
Clarified with Dr. Graciela Husbands that the patient needs urine studies for: 1) N-methyl histamine 2) Prostaglandin D2 or 11-beta- prostaglandin F2 alpha 3) Leukotriene E4   24 hour urine is preferred.  Called the [facility] lab- they do not do these test.  Called [facility]- they do 1) N-methyl histamine (24 hour)- 098119 2) Prostaglandin D2 (random)- 147829 - they do not perform the other test  Called [facility]- they do 1) N-methyl histamine (24 hour)- 39559 2) Prostaglandin D2 (not clear if a random/ 24 hour)- 10180 (send out test) 3) 11-beta- prostaglandin F2 alpha (24 hour)- 10109 4) Leukotriene E4  (24 hour)- 11976  I advised [facility] that we typically do not send out labs to them, so no account. Per [facility] rep- ok to write the orders on a RX pad and fax. She was unsure if the patient would need an appointment or if she could just stop by for the supplies.  Closest [facility] Location: [address], [place], [place] [address] Phone: [phone] Fax: [phone]  Will review with Dr. Graciela Husbands next week and then call the patient to advise of the above and see if she is agreeable with urine studies being done.

## 2021-07-09 NOTE — Telephone Encounter (Signed)
I spoke with the patient and advised her of Dr. Graciela Husbands recommendations to obtain urine studies as below to look for evidence of increased mast cells that could be possibly correlated to her present symptoms of POTS/ dysautonomia.   I have advised her she will need to have this done at [facility] in [place] as [facility]/ nor the hospital lab do these particular test.  The patient voices understanding and is agreeable.   She is aware I will fax the orders to [facility] for her to have this done, but I will also mail her a copy as well.   She is aware I will sent her a MyChart message with the contact information for [facility] as she will need to call them prior to going to pick up her supplies to inquire if she needs an appointment or if she can just walk in to pick these up.  The patient again voices understanding. [place], but will have these done when she returns.  She would like a copy of these results mailed to the address we have on file for her.

## 2021-07-11 NOTE — Telephone Encounter (Signed)
Copy of lab orders faxed to [facility] at [phone]. Confirmation received.  Original orders also mailed to the patient at the address on file.

## 2021-07-12 NOTE — Addendum Note (Signed)
Addended by: Theola Sequin on: 07/12/2021 04:56 PM   Modules accepted: Orders

## 2021-08-29 ENCOUNTER — Ambulatory Visit (INDEPENDENT_AMBULATORY_CARE_PROVIDER_SITE_OTHER): Payer: Managed Care, Other (non HMO) | Admitting: Internal Medicine

## 2021-08-29 ENCOUNTER — Other Ambulatory Visit: Payer: Self-pay

## 2021-08-29 ENCOUNTER — Encounter: Payer: Self-pay | Admitting: Internal Medicine

## 2021-08-29 VITALS — BP 110/72 | HR 79 | Ht 62.0 in | Wt 115.0 lb

## 2021-08-29 DIAGNOSIS — G901 Familial dysautonomia [Riley-Day]: Secondary | ICD-10-CM

## 2021-08-29 NOTE — Progress Notes (Signed)
Patient Care Team: Dionicia Abler, PA-C as PCP - General (Family Medicine)   HPI  Sydney Reyes is a 21 y.o. female seen in follow-up for syncope orthostatic tachycardia, inappropriate sinus tachycardia in the context of joint hypermobility, chronic joint pain, GI symptoms associate with gluten intolerance brain fog..  Seen initially 12/22 with recommendations of salt supplementation compression Energy and lightheadedness are somewhat improved particularly on the days that she can get her sodium and which she is trying to do with oral supplementation.  Fluid intake is replete.   Records and Results Reviewed   Past Medical History:  Diagnosis Date   ADHD    Anxiety    Asthma    OCD (obsessive compulsive disorder)     Past Surgical History:  Procedure Laterality Date   WISDOM TOOTH EXTRACTION      Current Meds  Medication Sig   albuterol (VENTOLIN HFA) 108 (90 Base) MCG/ACT inhaler SMARTSIG:2 Puff(s) By Mouth Every 6 Hours PRN   ALPRAZolam (XANAX) 0.5 MG tablet Take 0.5 mg by mouth daily as needed.   atomoxetine (STRATTERA) 40 MG capsule Take by mouth.   cyanocobalamin 1000 MCG tablet Take 1,000 mcg by mouth daily.   escitalopram (LEXAPRO) 10 MG tablet Take 10 mg by mouth daily.   magnesium 30 MG tablet Take 30 mg by mouth daily.   Melatonin 10 MG TABS Take 1 tablet by mouth at bedtime as needed.   montelukast (SINGULAIR) 10 MG tablet Take 10 mg by mouth daily.   Multiple Vitamin (MULTIVITAMIN) tablet Take 1 tablet by mouth every morning.   norethindrone-ethinyl estradiol-FE (LOESTRIN FE) 1-20 MG-MCG tablet Take 1 tablet by mouth daily.   sodium chloride 1 g tablet Take 1 tablet (1 g total) by mouth 3 (three) times daily. Pt states she picked up over the counter   traZODone (DESYREL) 50 MG tablet Take 50-150 mg by mouth at bedtime.    Allergies  Allergen Reactions   Prednisone Other (See Comments)    Dizziness/fainting   Gluten Meal       Review of  Systems negative except from HPI and PMH  Physical Exam BP 110/72 (BP Location: Right Arm, Patient Position: Sitting, Cuff Size: Normal)    Pulse 79    Ht 5\' 2"  (1.575 m)    Wt 115 lb (52.2 kg)    SpO2 99%    BMI 21.03 kg/m  Well developed and well nourished in no acute distress HENT normal E scleral and icterus clear Neck Supple JVP flat; carotids brisk and full Clear to ausculation Regular rate and rhythm, no murmurs gallops or rub Soft with active bowel sounds No clubbing cyanosis  Edema Alert and oriented, grossly normal motor and sensory function Skin Warm and Dry  ECG sinus at 79 Intervals 13/09/37  CrCl cannot be calculated (No successful lab value found.).   Assessment and  Plan Dysautonomia with orthostatic tachycardia  Joint hypermobility  The patient has symptoms consistent with dysautonomia with orthostatic heat and exercise intolerance.  We have reviewed again the importance of salt and fluid repletion and will have her increase her sodium intake up to 2 g of sodium (about 5 g of sodium chloride) per day.  We have reviewed sources of sodium supplementation including Pedialyte advance care, liquid IV, and LMNT  She is using abdominal compression and is still looking to find the right thigh compression  Encouraged her again to be attentive to the long-term psychological efforts required to have endured  her chronic illness.  We will continue to hold off on beta-blocker for right now  We will renew her handicap sticker  Lenghty discussion   Current medicines are reviewed at length with the patient today .  The patient does not  have concerns regarding medicines.

## 2021-08-29 NOTE — Patient Instructions (Signed)
Medication Instructions:  - Your physician recommends that you continue on your current medications as directed. Please refer to the Current Medication list given to you today.  *If you need a refill on your cardiac medications before your next appointment, please call your pharmacy*   Lab Work: - none ordered  If you have labs (blood work) drawn today and your tests are completely normal, you will receive your results only by: MyChart Message (if you have MyChart) OR A paper copy in the mail If you have any lab test that is abnormal or we need to change your treatment, we will call you to review the results.   Testing/Procedures: - none ordered   Follow-Up: At [facility], you and your health needs are our priority.  As part of our continuing mission to provide you with exceptional heart care, we have created designated Provider Care Teams.  These Care Teams include your primary Cardiologist (physician) and Advanced Practice Providers (APPs -  Physician Assistants and Nurse Practitioners) who all work together to provide you with the care you need, when you need it.  We recommend signing up for the patient portal called "MyChart".  Sign up information is provided on this After Visit Summary.  MyChart is used to connect with patients for Virtual Visits (Telemedicine).  Patients are able to view lab/test results, encounter notes, upcoming appointments, etc.  Non-urgent messages can be sent to your provider as well.   To learn more about what you can do with MyChart, go to [email or web address].    Your next appointment:   4 month(s)  The format for your next appointment:   In Person  Provider:   Sherryl Manges, MD    Other Instructions  You have been given a Disability Parking Placard Application today

## 2021-10-18 ENCOUNTER — Encounter: Payer: Self-pay | Admitting: Internal Medicine

## 2021-10-23 ENCOUNTER — Encounter: Payer: Self-pay | Admitting: *Deleted

## 2021-11-14 ENCOUNTER — Encounter: Payer: Self-pay | Admitting: Internal Medicine

## 2021-11-14 ENCOUNTER — Ambulatory Visit: Payer: Managed Care, Other (non HMO) | Admitting: Internal Medicine

## 2021-11-14 VITALS — BP 111/76 | HR 98 | Ht 62.0 in | Wt 112.0 lb

## 2021-11-14 DIAGNOSIS — R42 Dizziness and giddiness: Secondary | ICD-10-CM | POA: Diagnosis not present

## 2021-11-14 DIAGNOSIS — R000 Tachycardia, unspecified: Secondary | ICD-10-CM

## 2021-11-14 DIAGNOSIS — G901 Familial dysautonomia [Riley-Day]: Secondary | ICD-10-CM | POA: Diagnosis not present

## 2021-11-14 DIAGNOSIS — R002 Palpitations: Secondary | ICD-10-CM

## 2021-11-14 NOTE — Patient Instructions (Signed)
Medication Instructions:  - Your physician recommends that you continue on your current medications as directed. Please refer to the Current Medication list given to you today.  *If you need a refill on your cardiac medications before your next appointment, please call your pharmacy*   Lab Work: - none ordered  If you have labs (blood work) drawn today and your tests are completely normal, you will receive your results only by: MyChart Message (if you have MyChart) OR A paper copy in the mail If you have any lab test that is abnormal or we need to change your treatment, we will call you to review the results.   Testing/Procedures: - none ordered   Follow-Up: At [facility], you and your health needs are our priority.  As part of our continuing mission to provide you with exceptional heart care, we have created designated Provider Care Teams.  These Care Teams include your primary Cardiologist (physician) and Advanced Practice Providers (APPs -  Physician Assistants and Nurse Practitioners) who all work together to provide you with the care you need, when you need it.  We recommend signing up for the patient portal called "MyChart".  Sign up information is provided on this After Visit Summary.  MyChart is used to connect with patients for Virtual Visits (Telemedicine).  Patients are able to view lab/test results, encounter notes, upcoming appointments, etc.  Non-urgent messages can be sent to your provider as well.   To learn more about what you can do with MyChart, go to [email or web address].    Your next appointment:   6 month(s)  The format for your next appointment:   In Person  Provider:   Steven Klein, MD    Other Instructions N/a  Important Information About Sugar       

## 2021-11-14 NOTE — Progress Notes (Signed)
? ? ? ? ?Patient Care Team: ?Cyndia Diver, PA-C as PCP - General (Family Medicine) ? ? ?HPI ? ?Sydney Reyes is a 21 y.o. 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  Seen initially 12/22 with recommendations of salt supplementation and compression ? ?Fluid intake is replete; she has struggled however with sodium.  She does not mostly by diet has not tolerated oral potassium supplements. ? ?Not sleeping.  Struggles to fall asleep. ? ?Been a hard semester but better than the fall. ? ?Heart rate rapidly increases associate with dyspnea with modest exertion.  Handicap pass is allow her to drive around campus and attenuated some of her symptoms. ? ?Recent episode of brief syncope at [employer].  Typical prodrome. ? ? ?Records and Results Reviewed  ? ?Past Medical History:  ?Diagnosis Date  ? ADHD   ? Anxiety   ? Asthma   ? OCD (obsessive compulsive disorder)   ? ? ?Past Surgical History:  ?Procedure Laterality Date  ? WISDOM TOOTH EXTRACTION    ? ? ?Current Meds  ?Medication Sig  ? albuterol (VENTOLIN HFA) 108 (90 Base) MCG/ACT inhaler SMARTSIG:2 Puff(s) By Mouth Every 6 Hours PRN  ? ALPRAZolam (XANAX) 0.5 MG tablet Take 0.5 mg by mouth daily as needed.  ? atomoxetine (STRATTERA) 40 MG capsule Take by mouth.  ? cyanocobalamin 1000 MCG tablet Take 1,000 mcg by mouth daily.  ? escitalopram (LEXAPRO) 10 MG tablet Take 10 mg by mouth daily.  ? loratadine (CLARITIN) 10 MG tablet Take 10 mg by mouth daily.  ? magnesium 30 MG tablet Take 30 mg by mouth daily.  ? Melatonin 10 MG TABS Take 1 tablet by mouth at bedtime as needed.  ? montelukast (SINGULAIR) 10 MG tablet Take 10 mg by mouth daily.  ? norethindrone-ethinyl estradiol-FE (LOESTRIN FE) 1-20 MG-MCG tablet Take 1 tablet by mouth daily.  ? sodium chloride 1 g tablet Take 1 tablet (1 g total) by mouth  3 (three) times daily. Pt states she picked up over the counter  ? traZODone (DESYREL) 50 MG tablet Take 50-150 mg by mouth at bedtime.  ? ? ?Allergies  ?Allergen Reactions  ? Prednisone Other (See Comments)  ?  Dizziness/fainting  ? Gluten Meal   ? ? ? ? ?Review of Systems negative except from HPI and PMH ? ?Physical Exam ?BP 111/76 (BP Location: Right Arm, Patient Position: Sitting, Cuff Size: Normal)   Pulse 98   Ht 5\' 2"  (1.575 m)   Wt 112 lb (50.8 kg)   SpO2 97%   BMI 20.49 kg/m?  ?Well developed and nourished in no acute distress ?HENT normal ?Neck supple with JVP-  flat   ?Clear ?Regular rate and rhythm, no murmurs or gallops ?Abd-soft with active BS ?No Clubbing cyanosis edema ?Skin-warm and dry ?A & Oriented  Grossly normal sensory and motor function ? ?ECG sinus at 98 ?Intervals 07/06/1933 ?R prime in lead V1 ? ?CrCl cannot be calculated (No successful lab value found.). ? ? ?Assessment and  Plan ?Dysautonomia with orthostatic tachycardia ? ?Joint hypermobility ? ?Continues to use compression to some advantage.  Encouraged her to find a compression piece that includes most of her thighs instead of just the proximal thighs ?We again reviewed oral potassium supplements including liquid IV, LMnT, Pedialyte advanced care in 2 months I recently been told about my patients, banana bags and NaturaLyte ?  ?  Struggling to sleep. ? ?We will continue to hold off on the beta-blocker as she works on sodium supplementation. ?She is using abdominal compression and is still looking to find the right thigh compression ?  ? ?

## 2021-11-18 ENCOUNTER — Encounter: Payer: Self-pay | Admitting: Internal Medicine

## 2021-11-25 NOTE — Telephone Encounter (Signed)
Why dont we have her start the BB that CE Rx  ?Thanks SK   ?

## 2022-03-18 ENCOUNTER — Ambulatory Visit: Payer: Managed Care, Other (non HMO) | Admitting: Medical

## 2022-04-02 ENCOUNTER — Ambulatory Visit: Payer: BC Managed Care – PPO | Attending: Medical | Admitting: Medical

## 2022-04-02 ENCOUNTER — Encounter: Payer: Self-pay | Admitting: Medical

## 2022-04-02 VITALS — BP 100/62 | HR 96 | Ht 62.0 in | Wt 112.0 lb

## 2022-04-02 DIAGNOSIS — G901 Familial dysautonomia [Riley-Day]: Secondary | ICD-10-CM

## 2022-04-02 DIAGNOSIS — R000 Tachycardia, unspecified: Secondary | ICD-10-CM | POA: Diagnosis not present

## 2022-04-02 NOTE — Progress Notes (Signed)
Cardiology Office Note:    Date:  04/03/2022   ID:  Sydney Reyes, DOB 2000-08-17, MRN [id number]  PCP:  Cyndia Diver, PA-C  [facility] Cardiologist:  None  [facility] Electrophysiologist:  None   Referring MD: Cyndia Diver, PA-C   Chief Complaint: F/U dysautonomia  History of Present Illness:    Sydney Reyes is a 21 y.o. female with a hx of ADHD, anxiety, insomnia who presents for follow-up for tachycardia.   She was first seen in October 2022 for lightheadedness and elevated heart rate. She was concerned for POTS. Event monitor showed normal sinus rhythm with rare PACs and PVCs as 2 brief episodes of PSVT. Average heart rate was 107bpm.   She was referred to EP who recommended salt supplementation and compression. Lifestyle changes discussed in detail. She was also given metoprolol  Today, the patient reports persistent lightheadedness and elevated heart rate. The heat makes her symptoms worse. Yesterday heart rate was high 170bpm. This can be at rest. She feels her heart racing, but this is normal. No chest pain or SOB. No LLE, orthopnea or pnd. She feels lightheaded throughout the day, and may pass out, however she has not passed out. She has been drinking sodium drink. She was given metoprolol, but has not taken it. She is unable to take salt tablets due to vomiting. She is wondering about meds for ADHD, did not tolerate strattera or Adderall.   Past Medical History:  Diagnosis Date   ADHD    Anxiety    Asthma    OCD (obsessive compulsive disorder)     Past Surgical History:  Procedure Laterality Date   WISDOM TOOTH EXTRACTION      Current Medications: Current Meds  Medication Sig   albuterol (VENTOLIN HFA) 108 (90 Base) MCG/ACT inhaler SMARTSIG:2 Puff(s) By Mouth Every 6 Hours PRN   ALPRAZolam (XANAX) 0.5 MG tablet Take 0.5 mg by mouth daily as needed.   cyanocobalamin 1000 MCG tablet Take 1,000 mcg by mouth daily.   escitalopram (LEXAPRO) 10 MG  tablet Take 10 mg by mouth daily.   loratadine (CLARITIN) 10 MG tablet Take 10 mg by mouth daily.   magnesium 30 MG tablet Take 30 mg by mouth daily.   Melatonin 10 MG TABS Take 1 tablet by mouth at bedtime as needed.   montelukast (SINGULAIR) 10 MG tablet Take 10 mg by mouth daily.   norethindrone-ethinyl estradiol-FE (LOESTRIN FE) 1-20 MG-MCG tablet Take 1 tablet by mouth daily.   sodium chloride 1 g tablet Take 1 tablet (1 g total) by mouth 3 (three) times daily. Pt states she picked up over the counter   traZODone (DESYREL) 50 MG tablet Take 50-150 mg by mouth at bedtime.     Allergies:   Prednisone and Gluten meal   Social History   Socioeconomic History   Marital status: Single    Spouse name: Not on file   Number of children: Not on file   Years of education: Not on file   Highest education level: Not on file  Occupational History   Not on file  Tobacco Use   Smoking status: Never   Smokeless tobacco: Never  Vaping Use   Vaping Use: Never used  Substance and Sexual Activity   Alcohol use: No   Drug use: No   Sexual activity: Not on file  Other Topics Concern   Not on file  Social History Narrative   ** Merged History Encounter **  Social Determinants of Health   Financial Resource Strain: Not on file  Food Insecurity: Not on file  Transportation Needs: Not on file  Physical Activity: Not on file  Stress: Not on file  Social Connections: Not on file     Family History: The patient's family history includes Breast cancer in her maternal great-grandfather; Cancer in her paternal great-grandfather; Dementia in her paternal great-grandfather; Heart attack in her maternal great-grandfather; Ovarian cysts in her maternal grandmother; Stroke in her paternal grandfather. There is no history of Sudden Cardiac Death.  ROS:   Please see the history of present illness.     All other systems reviewed and are negative.  EKGs/Labs/Other Studies Reviewed:    The  following studies were reviewed today:  Heart monitor 2022 The patient was monitored for 11 days, 18 hours. The predominant rhythm was sinus with an average rate of 107 bpm (range 53-231 bpm in sinus). There were rare PAC's and PVC's. Two atrial runs lasting up to 8 beats were observed with a maximum rate of 231 bpm. There was no sustained arrhythmia or prolonged pause. Patient triggered events correspond to sinus rhythm.   Predominantly sinus rhythm with rare PAC's and PVC's, as well as two brief episodes of PSVT.  Elevated average heart rate noted.      EKG:  EKG is ordered today.  The ekg ordered today demonstrates NSR 96bpm, iRBBB, no changes  Recent Labs: No results found for requested labs within last 365 days.  Recent Lipid Panel No results found for: "CHOL", "TRIG", "HDL", "CHOLHDL", "VLDL", "LDLCALC", "LDLDIRECT"   Physical Exam:    VS:  BP 100/62 (BP Location: Right Arm, Patient Position: Sitting, Cuff Size: Normal)   Pulse 96   Ht 5\' 2"  (1.575 m)   Wt 112 lb (50.8 kg)   SpO2 99%   BMI 20.49 kg/m     Wt Readings from Last 3 Encounters:  04/02/22 112 lb (50.8 kg)  11/14/21 112 lb (50.8 kg) (18 %, Z= -0.90)*  08/29/21 115 lb (52.2 kg) (24 %, Z= -0.70)*   * Growth percentiles are based on CDC (Girls, 2-20 Years) data.     GEN:  Well nourished, well developed in no acute distress HEENT: Normal NECK: No JVD; No carotid bruits LYMPHATICS: No lymphadenopathy CARDIAC: RRR, no murmurs, rubs, gallops RESPIRATORY:  Clear to auscultation without rales, wheezing or rhonchi  ABDOMEN: Soft, non-tender, non-distended MUSCULOSKELETAL:  No edema; No deformity  SKIN: Warm and dry NEUROLOGIC:  Alert and oriented x 3 PSYCHIATRIC:  Normal affect   ASSESSMENT:    1. Dysautonomia (HCC)   2. Tachycardia    PLAN:    In order of problems listed above:  Dysautonomia Patient has persistent unchanged symptoms. She keeps salt packets in her car and takes them throughout the  day. She also has a salt drink she drinks throughout the day. She stays well hydrated. Doesn't wear compression during the summer. Yesterday she noted heart rates up to 170s, even at rest. EKG shows heart rate of 96bpm. She has metoprolol, but has not tried it. BP is soft today, however she says this is not normal. I recommend she try metoprolol for heart rate control. I advised she monitor BP with this. I will ask Dr. [birth year] about ADHD medications. We will have her follow-up with Dr. Graciela Husbands.   Disposition: Follow up in 3 month(s) with Dr. Graciela Husbands    Signed, Sanari Offner Graciela Husbands, PA-C  04/03/2022 1:01 PM    [facility]

## 2022-04-02 NOTE — Patient Instructions (Signed)
Medication Instructions:  Your physician recommends that you continue on your current medications as directed. Please refer to the Current Medication list given to you today.  *If you need a refill on your cardiac medications before your next appointment, please call your pharmacy*   Lab Work: None ordered If you have labs (blood work) drawn today and your tests are completely normal, you will receive your results only by: MyChart Message (if you have MyChart) OR A paper copy in the mail If you have any lab test that is abnormal or we need to change your treatment, we will call you to review the results.   Testing/Procedures: None ordered   Follow-Up: At [facility], you and your health needs are our priority.  As part of our continuing mission to provide you with exceptional heart care, we have created designated Provider Care Teams.  These Care Teams include your primary Cardiologist (physician) and Advanced Practice Providers (APPs -  Physician Assistants and Nurse Practitioners) who all work together to provide you with the care you need, when you need it.  We recommend signing up for the patient portal called "MyChart".  Sign up information is provided on this After Visit Summary.  MyChart is used to connect with patients for Virtual Visits (Telemedicine).  Patients are able to view lab/test results, encounter notes, upcoming appointments, etc.  Non-urgent messages can be sent to your provider as well.   To learn more about what you can do with MyChart, go to [email or web address].    Your next appointment:   3 month(s)  The format for your next appointment:   In Person  Provider:   Sherryl Manges, MD    Other Instructions N/A  Important Information About Sugar

## 2022-04-09 ENCOUNTER — Encounter: Payer: Self-pay | Admitting: Medical

## 2022-04-09 ENCOUNTER — Other Ambulatory Visit: Payer: Self-pay

## 2022-04-09 ENCOUNTER — Ambulatory Visit (INDEPENDENT_AMBULATORY_CARE_PROVIDER_SITE_OTHER): Payer: BC Managed Care – PPO | Admitting: Medical

## 2022-04-09 VITALS — BP 118/63 | HR 95 | Temp 99.4°F | Ht 62.01 in | Wt 112.0 lb

## 2022-04-09 DIAGNOSIS — J4530 Mild persistent asthma, uncomplicated: Secondary | ICD-10-CM

## 2022-04-09 DIAGNOSIS — Z20822 Contact with and (suspected) exposure to covid-19: Secondary | ICD-10-CM

## 2022-04-09 DIAGNOSIS — J069 Acute upper respiratory infection, unspecified: Secondary | ICD-10-CM

## 2022-04-09 NOTE — Progress Notes (Signed)
[facility] [address] [place], [place] [address] Phone: [phone] Fax: [phone]   Office Visit Note  Patient Name: Sydney Reyes  Date of [id number]  Med Rec number [id number]  Date of Service: 04/09/2022  Allergies: Prednisone and Gluten meal  Chief Complaint  Patient presents with   SICK     HPI Noted some fatigue and sore throat 5d ago. 3d ago, sore throat was worse, also had sneezing and itchy eyes. Has been lacking appetite last several days. Felt difficult to breathe due to pressure on chest last night, somewhat today as well. Hx of asthma, has used albuterol inhaler few times yesterday and today, little helpful. Has also had body aches (places she doesn't normally have achiness). Also some R-sided HA. Has nasal congestion, but not more than usual with allergies. Some cough today. Feels little out of breath even at rest but more so with exertion. Temp was 99.8 last night, states this is high for her. Had chills and sweats overnight.   Took Sudafed and albuterol inhaler today.  Her housemate tested positive for COVID today. Pt states they spend a lot of time together, thinks they shared a drink last night.   On Singulair for asthma control. Has had exacerbations in past when sick or allergies bad. Vaccinated and boosted for COVID, has had all recommended doses.  Patient states when she has had COVID-19 in past, rapid antigen test sometimes negative while PCR positive.   Current Medication:  Outpatient Encounter Medications as of 04/09/2022  Medication Sig   albuterol (VENTOLIN HFA) 108 (90 Base) MCG/ACT inhaler SMARTSIG:2 Puff(s) By Mouth Every 6 Hours PRN   ALPRAZolam (XANAX) 0.5 MG tablet Take 0.5 mg by mouth daily as needed.   atomoxetine (STRATTERA) 40 MG capsule Take by mouth. (Patient not taking: Reported on 04/02/2022)   cyanocobalamin 1000 MCG tablet Take 1,000 mcg by mouth daily.   escitalopram (LEXAPRO) 10 MG tablet Take 10 mg by mouth  daily.   loratadine (CLARITIN) 10 MG tablet Take 10 mg by mouth daily.   magnesium 30 MG tablet Take 30 mg by mouth daily.   Melatonin 10 MG TABS Take 1 tablet by mouth at bedtime as needed.   montelukast (SINGULAIR) 10 MG tablet Take 10 mg by mouth daily.   norethindrone-ethinyl estradiol-FE (LOESTRIN FE) 1-20 MG-MCG tablet Take 1 tablet by mouth daily.   sodium chloride 1 g tablet Take 1 tablet (1 g total) by mouth 3 (three) times daily. Pt states she picked up over the counter   traZODone (DESYREL) 50 MG tablet Take 50-150 mg by mouth at bedtime.   [DISCONTINUED] ADDERALL XR 10 MG 24 hr capsule Take 10 mg by mouth every morning. (Patient not taking: Reported on 06/27/2021)   [DISCONTINUED] desvenlafaxine (PRISTIQ) 50 MG 24 hr tablet Take 50 mg by mouth daily. (Patient not taking: Reported on 06/27/2021)   [DISCONTINUED] Viloxazine HCl (QELBREE PO) Take 400 mg by mouth daily. (Patient not taking: Reported on 06/27/2021)   No facility-administered encounter medications on file as of 04/09/2022.      Medical History: Past Medical History:  Diagnosis Date   ADHD    Anxiety    Asthma    OCD (obsessive compulsive disorder)      Vital Signs: BP 118/63   Pulse 95   Temp 99.4 F (37.4 C) (Tympanic)   Ht 5' 2.01" (1.575 m)   Wt 112 lb (50.8 kg)   SpO2 98%   BMI 20.48 kg/m  Review of Systems  Constitutional:  Positive for appetite change, chills, fatigue and fever (subjective).  HENT:  Positive for congestion, sneezing and sore throat.   Eyes:  Positive for itching.  Respiratory:  Positive for cough and shortness of breath.   Musculoskeletal:  Positive for myalgias.  Neurological:  Positive for headaches.    Physical Exam Vitals reviewed.  Constitutional:      General: She is not in acute distress.    Appearance: She is not ill-appearing.     Comments: Tired appearing  HENT:     Head: Normocephalic.     Right Ear: Ear canal and external ear normal.     Left Ear: Ear  canal and external ear normal.     Ears:     Comments: TMs dull bilaterally    Nose: Mucosal edema (mild), congestion (mild) and rhinorrhea present. Rhinorrhea is clear.     Mouth/Throat:     Mouth: Mucous membranes are moist. No oral lesions.     Pharynx: Posterior oropharyngeal erythema present. No pharyngeal swelling or uvula swelling.     Tonsils: No tonsillar exudate.     Comments: Tonsils not enlarged/inflamed. Cardiovascular:     Rate and Rhythm: Normal rate and regular rhythm.     Heart sounds: No murmur heard.    No friction rub. No gallop.  Pulmonary:     Effort: Pulmonary effort is normal. No respiratory distress.     Breath sounds: Normal breath sounds. No wheezing, rhonchi or rales.  Musculoskeletal:     Cervical back: Neck supple. No rigidity.  Lymphadenopathy:     Cervical: Cervical adenopathy (anterior, sl tender) present.  Neurological:     Mental Status: She is alert.     Assessment/Plan: 1. Upper respiratory tract infection, unspecified type 2. Contact with and (suspected) exposure to covid-19 Suspect viral illness, COVID-19 a good possibility given recent exposure. Will check COVID-19 NAAT.   -POCT COVID-19 antigen test done, Result: NEGATIVE; Patient not charged for test because test provided by [facility].  - Novel Coronavirus, NAA ([facility]) - Will send MyChart message with result when available.  -Despite negative antigen test, patient encouraged to avoid close contact with others and wear well-fitting mask when in public places given symptoms and exposure, at least until result of NAAT returns.   3. Mild persistent asthma, unspecified whether complicated Discussed option for inhaled steroid, patient prefers to defer this for now.  Patient Instructions: -Rest and stay well hydrated (by drinking water and other liquids). Avoid/limit caffeine. -Take over-the-counter medicines (i.e. Tylenol or Ibuprofen, Sudafed, cough suppressant) to help relieve your  symptoms. -Continue asthma/allergy medicines as prescribed. -For your sore throat/cough, use cough drops/throat lozenges, gargle warm salt water and/or drink warm liquids (like tea with honey). -You will receive a MyChart message notifying you of your lab results when they are available. -Send my chart message to provider in meantime as needed for new/worsening symptoms (i.e. increased shortness of breath, chest pain).     General Counseling: Anab verbalizes understanding of the findings of todays visit and agrees with plan of treatment. I have discussed any further diagnostic evaluation that may be needed or ordered today. We also reviewed her medications today. she has been encouraged to call the office with any questions or concerns that should arise related to todays visit.   No orders of the defined types were placed in this encounter.   No orders of the defined types were placed in this encounter.   Time spent:20  Minutes    Jonathon Resides PA-C [facility] 04/09/2022 1:13 PM

## 2022-04-10 LAB — NOVEL CORONAVIRUS, NAA: SARS-CoV-2, NAA: NOT DETECTED

## 2022-04-12 NOTE — Patient Instructions (Signed)
Even though your rapid COVID-19 test was negative, you should avoid close contact with others and wear a well-fitting mask when in public places until your symptoms resolve.  -Rest and stay well hydrated (by drinking water and other liquids). Avoid/limit caffeine. -Take over-the-counter medicines (i.e. Tylenol or Ibuprofen, Sudafed, cough suppressant) to help relieve your symptoms. -Continue asthma/allergy medicines as prescribed, including albuterol every 4-6 hours as needed for shortness of breath or chest pressure/tightness. -For your sore throat/cough, use cough drops/throat lozenges, gargle warm salt water and/or drink warm liquids (like tea with honey). -You will receive a MyChart message notifying you of your lab results when they are available. -Send my chart message to provider in meantime as needed for new/worsening symptoms (i.e. increased shortness of breath, chest pain).

## 2022-06-26 ENCOUNTER — Ambulatory Visit: Payer: BC Managed Care – PPO | Attending: Internal Medicine | Admitting: Internal Medicine

## 2022-06-26 ENCOUNTER — Encounter: Payer: Self-pay | Admitting: Internal Medicine

## 2022-06-26 VITALS — BP 92/60 | HR 71 | Ht 62.5 in | Wt 114.1 lb

## 2022-06-26 DIAGNOSIS — R000 Tachycardia, unspecified: Secondary | ICD-10-CM

## 2022-06-26 DIAGNOSIS — R002 Palpitations: Secondary | ICD-10-CM | POA: Diagnosis not present

## 2022-06-26 DIAGNOSIS — Z79899 Other long term (current) drug therapy: Secondary | ICD-10-CM | POA: Diagnosis not present

## 2022-06-26 DIAGNOSIS — G901 Familial dysautonomia [Riley-Day]: Secondary | ICD-10-CM

## 2022-06-26 MED ORDER — FLUDROCORTISONE ACETATE 0.1 MG PO TABS: ORAL_TABLET | ORAL | 1 refills | Status: DC

## 2022-06-26 NOTE — Patient Instructions (Signed)
Medication Instructions:  - Your physician has recommended you make the following change in your medication:   Next week/ when you get home for break 1) START Fludrocortisone (Florinef) 0.1 mg - take 1 tablet twice daily   *If you need a refill on your cardiac medications before your next appointment, please call your pharmacy*   Lab Work: - Your physician recommends that you have lab work :  sometime between Christmas & New Year's  BMP  Order given for you to have this drawn at home  If you have labs (blood work) drawn today and your tests are completely normal, you will receive your results only by: MyChart Message (if you have MyChart) OR A paper copy in the mail If you have any lab test that is abnormal or we need to change your treatment, we will call you to review the results.   Testing/Procedures: - none ordered   Follow-Up: At [facility], you and your health needs are our priority.  As part of our continuing mission to provide you with exceptional heart care, we have created designated Provider Care Teams.  These Care Teams include your primary Cardiologist (physician) and Advanced Practice Providers (APPs -  Physician Assistants and Nurse Practitioners) who all work together to provide you with the care you need, when you need it.  We recommend signing up for the patient portal called "MyChart".  Sign up information is provided on this After Visit Summary.  MyChart is used to connect with patients for Virtual Visits (Telemedicine).  Patients are able to view lab/test results, encounter notes, upcoming appointments, etc.  Non-urgent messages can be sent to your provider as well.   To learn more about what you can do with MyChart, go to [email or web address].    Your next appointment:   September 2024   The format for your next appointment:   In Person  Provider:   Sherryl Manges, MD    Other Instructions N/a  Important Information About  Sugar

## 2022-06-26 NOTE — Progress Notes (Signed)
Patient Care Team: Cyndia Diver, PA-C as PCP - General (Family Medicine)   HPI  Sydney Reyes is a 21 y.o. 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  Seen initially 12/22 with recommendations of salt supplementation and compression  Fluid intake is replete; she has struggled however with sodium.  She does not mostly by diet has not tolerated oral potassium supplements.  She ended up getting started on metoprolol (Dr. CE)--no clinical improvement  Improved salt intake but still less than 1 g a day.  Dizziness still an issue.  Tells me today that there is been a concern about hemochromatosis  Exam finished next week.  She is in [place] for the spring semester.  Date Cr K Hgb  10/22 0.7 4.5 14.9                Records and Results Reviewed   Past Medical History:  Diagnosis Date   ADHD    Anxiety    Asthma    Avoidant/restrictive food intake disorder    Dysautonomia (HCC)    Generalized hypermobility of joints    OCD (obsessive compulsive disorder)     Past Surgical History:  Procedure Laterality Date   WISDOM TOOTH EXTRACTION      Current Meds  Medication Sig   albuterol (VENTOLIN HFA) 108 (90 Base) MCG/ACT inhaler SMARTSIG:2 Puff(s) By Mouth Every 6 Hours PRN   ALPRAZolam (XANAX) 0.5 MG tablet Take 0.5 mg by mouth daily as needed.   cyanocobalamin 1000 MCG tablet Take 1,000 mcg by mouth daily.   escitalopram (LEXAPRO) 10 MG tablet Take 10 mg by mouth daily.   loratadine (CLARITIN) 10 MG tablet Take 10 mg by mouth daily.   magnesium 30 MG tablet Take 30 mg by mouth daily.   Melatonin 10 MG TABS Take 1 tablet by mouth at bedtime as needed.   metoprolol succinate (TOPROL-XL) 25 MG 24 hr tablet Take 25 mg by mouth daily.   montelukast (SINGULAIR) 10 MG tablet Take 10 mg by mouth daily.    norethindrone-ethinyl estradiol-FE (LOESTRIN FE) 1-20 MG-MCG tablet Take 1 tablet by mouth daily.   sodium chloride 1 g tablet Take 1 tablet (1 g total) by mouth 3 (three) times daily. Pt states she picked up over the counter   traZODone (DESYREL) 50 MG tablet Take 50-150 mg by mouth at bedtime.    Allergies  Allergen Reactions   Prednisone Other (See Comments)    Dizziness/fainting   Gluten Meal Other (See Comments)      Review of Systems negative except from HPI and PMH  Physical Exam BP 92/60 (BP Location: Left Arm, Patient Position: Sitting, Cuff Size: Normal)   Pulse 71   Ht 5' 2.5" (1.588 m)   Wt 114 lb 2 oz (51.8 kg)   SpO2 98%   BMI 20.54 kg/m  Well developed and nourished in no acute distress HENT normal Neck supple with JVP-  flat   Clear Regular rate and rhythm, no murmurs or gallops Abd-soft with active BS No Clubbing cyanosis edema Skin-warm and dry A & Oriented  Grossly normal sensory and motor function  ECG sinus at 71 Intervals 07/06/1936  CrCl cannot be calculated (No successful lab value found.).   Assessment and  Plan Dysautonomia with orthostatic tachycardia and low blood pressure  Joint hypermobility  Hemochromatosis?  Again encouraged  salt intake as well as fluid intake.  She is going home to [place], he has been an issue.  Recommend that she stop her metoprolol.  With her low blood pressure, have suggested we try fludrocortisone.  We will begin at 0.1 mg daily (possibly increasing to twice daily) and we will plan to check a metabolic profile in 2 weeks.  She had a history of hypokalemia her last blood work 11/22 had a normal potassium.  She tells me today that there is a concern about hemochromatosis.  We will plan to pursue this further when she returns from [place] as it is not been addressed up to them.  This regard there is a literature on rescan associating dysautonomia with hemochromatosis

## 2022-07-22 ENCOUNTER — Encounter: Payer: Self-pay | Admitting: Internal Medicine

## 2022-08-13 ENCOUNTER — Encounter: Payer: Self-pay | Admitting: Internal Medicine

## 2022-08-14 NOTE — Telephone Encounter (Signed)
I guess nothing left to do  Thanks SK

## 2022-12-21 ENCOUNTER — Encounter: Payer: Self-pay | Admitting: Internal Medicine

## 2022-12-24 MED ORDER — FLUDROCORTISONE ACETATE 0.1 MG PO TABS: ORAL_TABLET | ORAL | 0 refills | Status: DC

## 2023-03-04 ENCOUNTER — Encounter: Payer: Self-pay | Admitting: Internal Medicine

## 2023-03-31 ENCOUNTER — Encounter: Payer: Self-pay | Admitting: Internal Medicine

## 2023-03-31 ENCOUNTER — Encounter: Payer: Self-pay | Admitting: *Deleted

## 2023-03-31 ENCOUNTER — Inpatient Hospital Stay: Payer: BC Managed Care – PPO

## 2023-03-31 ENCOUNTER — Inpatient Hospital Stay: Payer: BC Managed Care – PPO | Attending: Internal Medicine | Admitting: Internal Medicine

## 2023-03-31 VITALS — BP 101/76 | HR 116 | Temp 98.0°F | Wt 111.0 lb

## 2023-03-31 DIAGNOSIS — Z803 Family history of malignant neoplasm of breast: Secondary | ICD-10-CM | POA: Diagnosis not present

## 2023-03-31 DIAGNOSIS — R7989 Other specified abnormal findings of blood chemistry: Secondary | ICD-10-CM | POA: Diagnosis not present

## 2023-03-31 DIAGNOSIS — R55 Syncope and collapse: Secondary | ICD-10-CM | POA: Diagnosis not present

## 2023-03-31 LAB — FERRITIN: Ferritin: 461 ng/mL — ABNORMAL HIGH (ref 11–307)

## 2023-03-31 LAB — IRON AND TIBC
Iron: 288 ug/dL — ABNORMAL HIGH (ref 28–170)
Saturation Ratios: 69 % — ABNORMAL HIGH (ref 10.4–31.8)
TIBC: 419 ug/dL (ref 250–450)
UIBC: 131 ug/dL

## 2023-03-31 LAB — C-REACTIVE PROTEIN: CRP: <0.5 mg/dL (ref <1.0)

## 2023-03-31 LAB — SEDIMENTATION RATE: Sed Rate: 6 mm/h (ref 0–20)

## 2023-03-31 NOTE — Progress Notes (Signed)
[facility]  [phone] [phone]  ID: Sydney Reyes OB: March 18, 2001  MR#: [id number]  AOZ#:[id number]  Patient Care Team: Cyndia Diver, PA-C as PCP - General (Family Medicine)  REFERRING PROVIDER: Cyndia Diver. PA-C  REASON FOR REFERRAL: elevated ferritin  HPI: Sydney Reyes is a 22 y.o. female with past medical history of dysautonomia on salt tablet and fludrocortisone, anxiety, ADHD was referred to hematology for elevated ferritin.  Patient reports about 2 years ago she had her ferritin level checked which was elevated to 800 with saturation about 60%.  At that time, had Nicolemarie test which showed abnormal copies and hemochromatosis.  She showed me in her phone but unable to tell exactly which copies are mutated.  Had repeat levels on 03/23/2023.  Iron 259, saturation 67%, ferritin 382.  WBC 6.9, hemoglobin 14.2 and platelet 308.  REVIEW OF SYSTEMS:   ROS  As per HPI. Otherwise, a complete review of systems is negative.  PAST MEDICAL HISTORY: Past Medical History:  Diagnosis Date   ADHD    Anxiety    Asthma    Avoidant/restrictive food intake disorder    Dysautonomia (HCC)    Generalized hypermobility of joints    OCD (obsessive compulsive disorder)     PAST SURGICAL HISTORY: Past Surgical History:  Procedure Laterality Date   WISDOM TOOTH EXTRACTION      FAMILY HISTORY: Family History  Problem Relation Age of Onset   Ovarian cysts Maternal Grandmother    Stroke Paternal Grandfather    Dementia Paternal Great-grandfather    Cancer Paternal Great-grandfather    Breast cancer Maternal Great-grandfather    Heart attack Maternal Great-grandfather    Sudden Cardiac Death Neg Hx     HEALTH MAINTENANCE: Social History   Tobacco Use   Smoking status: Never   Smokeless tobacco: Never  Vaping Use   Vaping status: Never Used  Substance Use Topics   Alcohol use: Yes   Drug use: No     Allergies  Allergen  Reactions   Prednisone Other (See Comments)    Dizziness/fainting   Gluten Meal Other (See Comments)    Current Outpatient Medications  Medication Sig Dispense Refill   albuterol (VENTOLIN HFA) 108 (90 Base) MCG/ACT inhaler SMARTSIG:2 Puff(s) By Mouth Every 6 Hours PRN     ALPRAZolam (XANAX) 0.5 MG tablet Take 0.5 mg by mouth daily as needed.     cyanocobalamin 1000 MCG tablet Take 1,000 mcg by mouth daily.     escitalopram (LEXAPRO) 10 MG tablet Take 10 mg by mouth daily.     fludrocortisone (FLORINEF) 0.1 MG tablet Take 1 tablet (0.1 mg) by mouth twice daily 180 tablet 0   loratadine (CLARITIN) 10 MG tablet Take 10 mg by mouth daily.     magnesium 30 MG tablet Take 30 mg by mouth daily.     Melatonin 10 MG TABS Take 1 tablet by mouth at bedtime as needed.     montelukast (SINGULAIR) 10 MG tablet Take 10 mg by mouth daily.     norethindrone-ethinyl estradiol-FE (LOESTRIN FE) 1-20 MG-MCG tablet Take 1 tablet by mouth daily.     ondansetron (ZOFRAN-ODT) 4 MG disintegrating tablet Take 4 mg by mouth every 8 (eight) hours as needed.     traZODone (DESYREL) 50 MG tablet Take 50-150 mg by mouth at bedtime.     sodium chloride 1 g tablet Take 1 tablet (1 g total) by mouth 3 (three) times daily. Pt states she picked up over the  counter (Patient not taking: Reported on 03/31/2023)     No current facility-administered medications for this visit.    OBJECTIVE: Vitals:   03/31/23 1146  BP: 101/76  Pulse: (!) 116  Temp: 98 F (36.7 C)  SpO2: 100%     Body mass index is 19.98 kg/m.      General: Well-developed, well-nourished, no acute distress. Eyes: Pink conjunctiva, anicteric sclera. HEENT: Normocephalic, moist mucous membranes, clear oropharnyx. Lungs: Clear to auscultation bilaterally. Heart: Regular rate and rhythm. No rubs, murmurs, or gallops. Abdomen: Soft, nontender, nondistended. No organomegaly noted, normoactive bowel sounds. Musculoskeletal: No edema, cyanosis, or  clubbing. Neuro: Alert, answering all questions appropriately. Cranial nerves grossly intact. Skin: No rashes or petechiae noted. Psych: Normal affect. Lymphatics: No cervical, calvicular, axillary or inguinal LAD.   LAB RESULTS:  No results found for: "NA", "K", "CL", "CO2", "GLUCOSE", "BUN", "CREATININE", "CALCIUM", "PROT", "ALBUMIN", "AST", "ALT", "ALKPHOS", "BILITOT", "GFRNONAA", "GFRAA"  No results found for: "WBC", "NEUTROABS", "HGB", "HCT", "MCV", "PLT"  No results found for: "TIBC", "FERRITIN", "IRONPCTSAT"   STUDIES: No results found.  ASSESSMENT AND PLAN:   Sydney Reyes is a 22 y.o. female with pmh of dysautonomia on salt tablet and fludrocortisone, anxiety, ADHD was referred to hematology for elevated ferritin.  # Elevated ferritin -Of unclear etiology.  Patient reports about 2 years ago she had her ferritin level checked which was elevated to 800 with saturation about 60%.  At that time, had Rex test which showed abnormal copies and hemochromatosis.  Made dietary changes.  She showed me in her phone but unable to tell exactly which copies are mutated.  - Had repeat levels on 03/23/2023.  Iron 259, saturation 67%, ferritin 382.  WBC 6.9, hemoglobin 14.2 and platelet 308. -To her knowledge, no history of hereditary hemochromatosis.  I will obtain labs as below.  Also ultrasound of the abdomen to rule out any fatty liver disease which can also cause elevation in ferritin.  # Vasovagal syncope -Triggered by blood draw.  Patient has history of dysautonomia and syncopal episodes.  She is on salt tablet and fludrocortisone. -Patient was seen in the labs.  Blood pressure normal.  Mildly tachycardic.  Was able to communicate.  Just feeling little lightheaded.  Work note provided.  Orders Placed This Encounter  Procedures   US Abdomen Complete   Iron and TIBC   Ferritin   Hemochromatosis DNA-PCR(c282y,h63d)   Sedimentation rate   C-reactive protein   RTC in 3 weeks  for MD visit to discuss labs.  Patient expressed understanding and was in agreement with this plan. She also understands that She can call clinic at any time with any questions, concerns, or complaints.   I spent a total of 45 minutes reviewing chart data, face-to-face evaluation with the patient, counseling and coordination of care as detailed above.  Michaelyn Barter, MD   03/31/2023 1:55 PM

## 2023-04-02 ENCOUNTER — Ambulatory Visit: Payer: BC Managed Care – PPO | Attending: Internal Medicine | Admitting: Internal Medicine

## 2023-04-02 ENCOUNTER — Encounter: Payer: Self-pay | Admitting: Internal Medicine

## 2023-04-02 VITALS — BP 121/83 | HR 121 | Ht 62.5 in | Wt 113.0 lb

## 2023-04-02 DIAGNOSIS — R42 Dizziness and giddiness: Secondary | ICD-10-CM | POA: Diagnosis not present

## 2023-04-02 NOTE — Patient Instructions (Signed)
Medication Instructions:  The current medical regimen is effective;  continue present plan and medications.  *If you need a refill on your cardiac medications before your next appointment, please call your pharmacy*   Follow-Up: At [facility], you and your health needs are our priority.  As part of our continuing mission to provide you with exceptional heart care, we have created designated Provider Care Teams.  These Care Teams include your primary Cardiologist (physician) and Advanced Practice Providers (APPs -  Physician Assistants and Nurse Practitioners) who all work together to provide you with the care you need, when you need it.  We recommend signing up for the patient portal called "MyChart".  Sign up information is provided on this After Visit Summary.  MyChart is used to connect with patients for Virtual Visits (Telemedicine).  Patients are able to view lab/test results, encounter notes, upcoming appointments, etc.  Non-urgent messages can be sent to your provider as well.   To learn more about what you can do with MyChart, go to [email or web address].    Your next appointment:   6 month(s)  Provider:   Sherryl Manges, MD    Other Instructions  Recommended salt supplementation.  The goal is about 7-10 g of sodium, not sodium chloride, a day.  Salt supplements include oral ThermaTabs, buffered sodium preparation, SaltStick Vitassium,  Other options include NUUN.  This comes in pill form and is dissolved in liquids.  Other liquid preparations include liquid IV, Pedialyte advance care, TRI-oral Also discussed the role of compression wear including thigh sleeves and abdominal binders.  Calf compression is not specifically recommended.Marland Kitchen

## 2023-04-02 NOTE — Progress Notes (Signed)
Patient Care Team: Cyndia Diver, PA-C as PCP - General (Family Medicine)   HPI  Sydney Reyes is a 22 y.o. 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  Seen initially 12/22 with recommendations of salt supplementation and compression  Fluid intake is replete; she has struggled however with sodium.  She does not mostly by diet has not tolerated oral potassium supplements.  She ended up getting started on metoprolol (Dr. CE)--no clinical improvement  Working on salt intake but still less than 1 g a day.  Had a syncopal episode during phlebotomy with her hematology visit the other day.  Orthostatic lightheadedness exertional tachypalpitations with exercise intolerance; heat intolerance.  Menses currently suppressed  Significant joint pains and insomnia related in part to pain  Concern remains for hemachromatosis, elevated ferritin and Fe sat, DNA testing pending  She was in [place] for the spring and mostly did well   Date Cr K Hgb  10/22 0.7 4.5 14.9   8/24   4.0          Records and Results Reviewed   Past Medical History:  Diagnosis Date   ADHD    Anxiety    Asthma    Avoidant/restrictive food intake disorder    Dysautonomia (HCC)    Generalized hypermobility of joints    OCD (obsessive compulsive disorder)     Past Surgical History:  Procedure Laterality Date   WISDOM TOOTH EXTRACTION      Current Meds  Medication Sig   albuterol (VENTOLIN HFA) 108 (90 Base) MCG/ACT inhaler SMARTSIG:2 Puff(s) By Mouth Every 6 Hours PRN   ALPRAZolam (XANAX) 0.5 MG tablet Take 0.5 mg by mouth daily as needed.   cyanocobalamin 1000 MCG tablet Take 1,000 mcg by mouth daily.   escitalopram (LEXAPRO) 10 MG tablet Take 10 mg by mouth daily.   fludrocortisone (FLORINEF) 0.1 MG tablet Take 1 tablet (0.1 mg)  by mouth twice daily (Patient taking differently: daily. Take 1 tablet (0.1 mg) by mouth)   loratadine (CLARITIN) 10 MG tablet Take 10 mg by mouth daily.   magnesium 30 MG tablet Take 30 mg by mouth daily.   Melatonin 10 MG TABS Take 1 tablet by mouth at bedtime as needed.   montelukast (SINGULAIR) 10 MG tablet Take 10 mg by mouth daily.   norethindrone-ethinyl estradiol-FE (LOESTRIN FE) 1-20 MG-MCG tablet Take 1 tablet by mouth daily.   ondansetron (ZOFRAN-ODT) 4 MG disintegrating tablet Take 4 mg by mouth every 8 (eight) hours as needed.   traZODone (DESYREL) 50 MG tablet Take 50-150 mg by mouth at bedtime.    Allergies  Allergen Reactions   Prednisone Other (See Comments)    Dizziness/fainting   Gluten Meal Other (See Comments)      Review of Systems negative except from HPI and PMH  Physical Exam BP 121/83 (Patient Position: Standing)   Pulse (!) 121   Ht 5' 2.5" (1.588 m)   Wt 113 lb (51.3 kg)   LMP 03/02/2023   SpO2 97%   BMI 20.34 kg/m  Well developed and nourished in no acute distress HENT normal Neck supple with JVP-  flat   Clear Regular rate and rhythm, no murmurs or gallops Abd-soft with active BS No Clubbing cyanosis edema Skin-warm and dry A & Oriented  Grossly normal sensory and motor function  ECG sinus @ 92  07/07/35 rsr'   CrCl cannot be calculated (No successful lab value found.).   Assessment and  Plan Dysautonomia with orthostatic tachycardia and low blood pressure  Joint hypermobility/Ehlers-Danlos  Hemochromatosis?   Continues with symptoms.  Again reviewed the physiology and stressed the importance of sodium repletion to goal of 3 g/day.  On fludrocortisone currently taking 0.1 mg daily.  Last potassium level was in range   await the results from the hemochromatosis testing.  Given joint hypermobility and significant pain, have recommended that she reach out to [facility]  Will need to make sure she has autonomic follow-up in [place]  when she graduates

## 2023-04-03 ENCOUNTER — Ambulatory Visit
Admission: RE | Admit: 2023-04-03 | Discharge: 2023-04-03 | Disposition: A | Discharge status: Home / Self Care | Payer: BC Managed Care – PPO | Source: Ambulatory Visit | Attending: Internal Medicine | Admitting: Internal Medicine

## 2023-04-03 DIAGNOSIS — R7989 Other specified abnormal findings of blood chemistry: Secondary | ICD-10-CM | POA: Diagnosis present

## 2023-04-09 LAB — HEMOCHROMATOSIS DNA-PCR(C282Y,H63D)

## 2023-04-21 ENCOUNTER — Inpatient Hospital Stay (HOSPITAL_BASED_OUTPATIENT_CLINIC_OR_DEPARTMENT_OTHER): Payer: BC Managed Care – PPO | Admitting: Internal Medicine

## 2023-04-21 VITALS — BP 103/87 | HR 123 | Temp 98.7°F | Wt 112.0 lb

## 2023-04-21 DIAGNOSIS — R7989 Other specified abnormal findings of blood chemistry: Secondary | ICD-10-CM

## 2023-04-21 NOTE — Progress Notes (Signed)
[facility]  [phone] [phone]  ID: Sydney Reyes OB: [birth year]  MR#: [id number]  DGU#:[id number]  Patient Care Team: Cyndia Diver, PA-C as PCP - General (Family Medicine) Michaelyn Barter, MD as Consulting Physician (Oncology)  REFERRING PROVIDER: Cyndia Diver. PA-C  REASON FOR REFERRAL: elevated ferritin  HPI: Sydney Reyes is a 22 y.o. female with past medical history of dysautonomia on salt tablet and fludrocortisone, anxiety, ADHD was referred to hematology for elevated ferritin.  Patient reports about 2 years ago she had her ferritin level checked which was elevated to 800 with saturation about 60%.  At that time, had Iman test which showed abnormal copies for hemochromatosis.  She showed me in her phone but unable to tell exactly which copies are mutated.  Had repeat levels on 03/23/2023.  Iron 259, saturation 67%, ferritin 382.  WBC 6.9, hemoglobin 14.2 and platelet 308.  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  Interval history Patient was seen today as follow-up to discuss labs She denies any new concerns.  REVIEW OF SYSTEMS:   ROS  As per HPI. Otherwise, a complete review of systems is negative.  PAST MEDICAL HISTORY: Past Medical History:  Diagnosis Date   ADHD    Anxiety    Asthma    Avoidant/restrictive food intake disorder    Dysautonomia (HCC)    Generalized hypermobility of joints    OCD (obsessive compulsive disorder)     PAST SURGICAL HISTORY: Past Surgical History:  Procedure Laterality Date   WISDOM TOOTH EXTRACTION      FAMILY HISTORY: Family History  Problem Relation Age of Onset   Ovarian cysts Maternal Grandmother    Stroke Paternal Grandfather    Dementia Paternal Great-grandfather    Cancer Paternal Great-grandfather    Breast cancer Maternal Great-grandfather    Heart attack Maternal Great-grandfather    Sudden Cardiac Death Neg Hx     HEALTH  MAINTENANCE: Social History   Tobacco Use   Smoking status: Never   Smokeless tobacco: Never  Vaping Use   Vaping status: Never Used  Substance Use Topics   Alcohol use: Yes   Drug use: No     Allergies  Allergen Reactions   Prednisone Other (See Comments)    Dizziness/fainting   Gluten Meal Other (See Comments)    Current Outpatient Medications  Medication Sig Dispense Refill   albuterol (VENTOLIN HFA) 108 (90 Base) MCG/ACT inhaler SMARTSIG:2 Puff(s) By Mouth Every 6 Hours PRN     ALPRAZolam (XANAX) 0.5 MG tablet Take 0.5 mg by mouth daily as needed.     cyanocobalamin 1000 MCG tablet Take 1,000 mcg by mouth daily.     escitalopram (LEXAPRO) 10 MG tablet Take 10 mg by mouth daily.     fludrocortisone (FLORINEF) 0.1 MG tablet Take 1 tablet (0.1 mg) by mouth twice daily (Patient taking differently: daily. Take 1 tablet (0.1 mg) by mouth) 180 tablet 0   loratadine (CLARITIN) 10 MG tablet Take 10 mg by mouth daily.     magnesium 30 MG tablet Take 30 mg by mouth daily.     Melatonin 10 MG TABS Take 1 tablet by mouth at bedtime as needed.     montelukast (SINGULAIR) 10 MG tablet Take 10 mg by mouth daily.     norethindrone-ethinyl estradiol-FE (LOESTRIN FE) 1-20 MG-MCG tablet Take 1 tablet by mouth daily.     ondansetron (ZOFRAN-ODT) 4 MG disintegrating tablet Take 4 mg by mouth every 8 (eight) hours as needed.  traZODone (DESYREL) 50 MG tablet Take 50-150 mg by mouth at bedtime.     No current facility-administered medications for this visit.    OBJECTIVE: Vitals:   04/21/23 1022  BP: 103/87  Pulse: (!) 123  Temp: 98.7 F (37.1 C)  SpO2: 98%     Body mass index is 20.16 kg/m.      General: Well-developed, well-nourished, no acute distress. Eyes: Pink conjunctiva, anicteric sclera. HEENT: Normocephalic, moist mucous membranes, clear oropharnyx. Lungs: Clear to auscultation bilaterally. Heart: Regular rate and rhythm. No rubs, murmurs, or gallops. Abdomen: Soft,  nontender, nondistended. No organomegaly noted, normoactive bowel sounds. Musculoskeletal: No edema, cyanosis, or clubbing. Neuro: Alert, answering all questions appropriately. Cranial nerves grossly intact. Skin: No rashes or petechiae noted. Psych: Normal affect. Lymphatics: No cervical, calvicular, axillary or inguinal LAD.   LAB RESULTS:  No results found for: "NA", "K", "CL", "CO2", "GLUCOSE", "BUN", "CREATININE", "CALCIUM", "PROT", "ALBUMIN", "AST", "ALT", "ALKPHOS", "BILITOT", "GFRNONAA", "GFRAA"  No results found for: "WBC", "NEUTROABS", "HGB", "HCT", "MCV", "PLT"  Lab Results  Component Value Date   TIBC 419 03/31/2023   FERRITIN 461 (H) 03/31/2023   IRONPCTSAT 69 (H) 03/31/2023     STUDIES: US Abdomen Complete  Result Date: 04/03/2023 CLINICAL DATA:  Elevated ferritin EXAM: ABDOMEN ULTRASOUND COMPLETE COMPARISON:  None Available. FINDINGS: Gallbladder: No gallstones or wall thickening visualized. No sonographic Murphy sign noted by sonographer. Common bile duct: Diameter: 2.5 mm Liver: No focal lesion identified. Within normal limits in parenchymal echogenicity. Portal vein is patent on color Doppler imaging with normal direction of blood flow towards the liver. IVC: No abnormality visualized. Pancreas: Visualized portion unremarkable. Spleen: Size and appearance within normal limits. Right Kidney: Length: 10.8 cm. Echogenicity within normal limits. No mass or hydronephrosis visualized. Left Kidney: Length: 10.3 cm. Echogenicity within normal limits. No mass or hydronephrosis visualized. Abdominal aorta: No aneurysm visualized. Other findings: None. IMPRESSION: 1. No cholelithiasis or sonographic evidence for acute cholecystitis. 2. No acute intra-abdominal process. Electronically Signed   By: Annia Belt M.D.   On: 04/03/2023 15:12    ASSESSMENT AND PLAN:   Sydney Reyes is a 22 y.o. female with pmh of dysautonomia on salt tablet and fludrocortisone, anxiety, ADHD was referred  to hematology for elevated ferritin.  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  WBC 6.9, hemoglobin 14.2 and platelet 308. -Ultrasound abdomen was normal.  ESR and CRP normal.  -Gene panel positive for homozygous C282Y mutation consistent with hereditary hemochromatosis.  -We discussed about phlebotomy to decrease the ferritin level and in turn reduce the risk of long-term organ damage from iron overload.  Patient has diagnosis of POTS and is concerned about tolerability with phlebotomy.  Last visit during the blood draw, she did have syncopal episode. We discussed alternative therapies such as iron chelating agent with deferasirox/ Jadenu, deferiprone and deferoxamine.  Iron chelating agents are seldom used in [facility] but there is a strong concern for poor tolerance with phlebotomies.  Side effects were discussed such as elevated LFTs level, decreased blood count, GI symptoms such as diarrhea, nausea, ophthalmologic exam depending on the type of chelator.  She is not interested in injectables.  Oral medications will work better with her schedule.  Currently her ferritin is maintained between 300-400.  I would like to repeat her iron level in 2 months.  If ferritin continues to go up we will consider starting her on oral chelation.  Patient education material was provided  for all the 3 agents.   Orders Placed This Encounter  Procedures   Iron and TIBC   Ferritin   CBC with Differential (Cancer Center Only)   RTC in 2 months for MD visit, labs 2 days prior Patient expressed understanding and was in agreement with this plan. She also understands that She can call clinic at any time with any questions, concerns, or complaints.   I spent a total of 30 minutes reviewing chart data, face-to-face evaluation with the patient, counseling and coordination of care as detailed above.  Michaelyn Barter, MD   04/21/2023 4:03 PM

## 2023-04-21 NOTE — Progress Notes (Signed)
Patient is still having trouble with her appetite, neuropathy, shortness of breath, and unable to sleep good at night. No new questions except for the results from her genetics test and the [place] that she had on 04/03/2023

## 2023-05-28 ENCOUNTER — Ambulatory Visit (INDEPENDENT_AMBULATORY_CARE_PROVIDER_SITE_OTHER): Payer: BC Managed Care – PPO | Admitting: Psychiatry

## 2023-05-28 ENCOUNTER — Encounter: Payer: Self-pay | Admitting: Psychiatry

## 2023-05-28 VITALS — BP 113/77 | HR 101 | Temp 97.9°F | Ht 62.5 in | Wt 114.2 lb

## 2023-05-28 DIAGNOSIS — F32A Depression, unspecified: Secondary | ICD-10-CM

## 2023-05-28 DIAGNOSIS — F411 Generalized anxiety disorder: Secondary | ICD-10-CM

## 2023-05-28 DIAGNOSIS — Z79899 Other long term (current) drug therapy: Secondary | ICD-10-CM | POA: Insufficient documentation

## 2023-05-28 DIAGNOSIS — F429 Obsessive-compulsive disorder, unspecified: Secondary | ICD-10-CM | POA: Diagnosis not present

## 2023-05-28 DIAGNOSIS — Z8659 Personal history of other mental and behavioral disorders: Secondary | ICD-10-CM

## 2023-05-28 DIAGNOSIS — F633 Trichotillomania: Secondary | ICD-10-CM

## 2023-05-28 DIAGNOSIS — Q796 Ehlers-Danlos syndrome, unspecified: Secondary | ICD-10-CM | POA: Insufficient documentation

## 2023-05-28 MED ORDER — BUSPIRONE HCL 5 MG PO TABS: 5.0000 mg | ORAL_TABLET | Freq: Two times a day (BID) | ORAL | 1 refills | Status: DC
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
## 2023-05-28 NOTE — Patient Instructions (Signed)
Buspirone Tablets What is this medication? BUSPIRONE (byoo SPYE rone) treats anxiety. It works by balancing the levels of dopamine and serotonin in your brain, substances that help regulate mood. This medicine may be used for other purposes; ask your health care provider or pharmacist if you have questions. COMMON BRAND NAME(S): BuSpar, Buspar Dividose What should I tell my care team before I take this medication? They need to know if you have any of these conditions: Kidney or liver disease An unusual or allergic reaction to buspirone, other medications, foods, dyes, or preservatives Pregnant or trying to get pregnant Breast-feeding How should I use this medication? Take this medication by mouth with a glass of water. Follow the directions on the prescription label. You may take this medication with or without food. To ensure that this medication always works the same way for you, you should take it either always with or always without food. Take your doses at regular intervals. Do not take your medication more often than directed. Do not stop taking except on the advice of your care team. Talk to your care team about the use of this medication in children. Special care may be needed. Overdosage: If you think you have taken too much of this medicine contact a poison control center or emergency room at once. NOTE: This medicine is only for you. Do not share this medicine with others. What if I miss a dose? If you miss a dose, take it as soon as you can. If it is almost time for your next dose, take only that dose. Do not take double or extra doses. What may interact with this medication? Do not take this medication with any of the following: Linezolid MAOIs like Carbex, Eldepryl, Marplan, Nardil, and Parnate Methylene blue Procarbazine This medication may also interact with the following: Diazepam Digoxin Diltiazem Erythromycin Grapefruit juice Haloperidol Medications for mental  depression or mood problems Medications for seizures like carbamazepine, phenobarbital and phenytoin Nefazodone Other medications for anxiety Rifampin Ritonavir Some antifungal medications like itraconazole, ketoconazole, and voriconazole Verapamil Warfarin This list may not describe all possible interactions. Give your health care provider a list of all the medicines, herbs, non-prescription drugs, or dietary supplements you use. Also tell them if you smoke, drink alcohol, or use illegal drugs. Some items may interact with your medicine. What should I watch for while using this medication? Visit your care team for regular checks on your progress. It may take 1 to 2 weeks before your anxiety gets better. This medication may affect your coordination, reaction time, or judgment. Do not drive or operate machinery until you know how this medication affects you. Sit up or stand slowly to reduce the risk of dizzy or fainting spells. Drinking alcohol with this medication can increase the risk of these side effects. What side effects may I notice from receiving this medication? Side effects that you should report to your care team as soon as possible: Allergic reactions--skin rash, itching, hives, swelling of the face, lips, tongue, or throat Irritability, confusion, fast or irregular heartbeat, muscle stiffness, twitching muscles, sweating, high fever, seizure, chills, vomiting, diarrhea, which may be signs of serotonin syndrome Side effects that usually do not require medical attention (report to your care team if they continue or are bothersome): Anxiety, nervousness Dizziness Drowsiness Headache Nausea Trouble sleeping This list may not describe all possible side effects. Call your doctor for medical advice about side effects. You may report side effects to FDA at 1-800-FDA-1088. Where should I keep   my medication? Keep out of the reach of children. Store at room temperature below 30 degrees C  (86 degrees F). Protect from light. Keep container tightly closed. Throw away any unused medication after the expiration date. NOTE: This sheet is a summary. It may not cover all possible information. If you have questions about this medicine, talk to your doctor, pharmacist, or health care provider.  2024 Elsevier/Gold Standard (2022-02-03 00:00:00)  

## 2023-06-11 ENCOUNTER — Inpatient Hospital Stay: Payer: BC Managed Care – PPO | Attending: Internal Medicine

## 2023-06-11 DIAGNOSIS — Z809 Family history of malignant neoplasm, unspecified: Secondary | ICD-10-CM | POA: Diagnosis not present

## 2023-06-11 DIAGNOSIS — Z803 Family history of malignant neoplasm of breast: Secondary | ICD-10-CM | POA: Diagnosis not present

## 2023-06-11 DIAGNOSIS — R7989 Other specified abnormal findings of blood chemistry: Secondary | ICD-10-CM

## 2023-06-11 LAB — COMPREHENSIVE METABOLIC PANEL
ALT: 15 U/L (ref 0–44)
AST: 20 U/L (ref 15–41)
Albumin: 4 g/dL (ref 3.5–5.0)
Alkaline Phosphatase: 34 U/L — ABNORMAL LOW (ref 38–126)
Anion gap: 9 (ref 5–15)
BUN: 11 mg/dL (ref 6–20)
CO2: 24 mmol/L (ref 22–32)
Calcium: 9 mg/dL (ref 8.9–10.3)
Chloride: 104 mmol/L (ref 98–111)
Creatinine, Ser: 0.75 mg/dL (ref 0.44–1.00)
GFR, Estimated: >60 mL/min (ref >60)
Glucose, Bld: 159 mg/dL — ABNORMAL HIGH (ref 70–99)
Potassium: 3.8 mmol/L (ref 3.5–5.1)
Sodium: 137 mmol/L (ref 135–145)
Total Bilirubin: 0.6 mg/dL (ref <1.2)
Total Protein: 7.3 g/dL (ref 6.5–8.1)

## 2023-06-11 LAB — CBC WITH DIFFERENTIAL (CANCER CENTER ONLY)
Abs Immature Granulocytes: 0.02 10*3/uL (ref 0.00–0.07)
Basophils Absolute: 0 10*3/uL (ref 0.0–0.1)
Basophils Relative: 0 %
Eosinophils Absolute: 0.2 10*3/uL (ref 0.0–0.5)
Eosinophils Relative: 4 %
HCT: 40.2 % (ref 36.0–46.0)
Hemoglobin: 13.6 g/dL (ref 12.0–15.0)
Immature Granulocytes: 0 %
Lymphocytes Relative: 32 %
Lymphs Abs: 1.9 10*3/uL (ref 0.7–4.0)
MCH: 31.7 pg (ref 26.0–34.0)
MCHC: 33.8 g/dL (ref 30.0–36.0)
MCV: 93.7 fL (ref 80.0–100.0)
Monocytes Absolute: 0.3 10*3/uL (ref 0.1–1.0)
Monocytes Relative: 5 %
Neutro Abs: 3.6 10*3/uL (ref 1.7–7.7)
Neutrophils Relative %: 59 %
Platelet Count: 284 10*3/uL (ref 150–400)
RBC: 4.29 MIL/uL (ref 3.87–5.11)
RDW: 12 % (ref 11.5–15.5)
WBC Count: 6.1 10*3/uL (ref 4.0–10.5)
nRBC: 0 % (ref 0.0–0.2)

## 2023-06-11 LAB — IRON AND TIBC
Iron: 316 ug/dL — ABNORMAL HIGH (ref 28–170)
Saturation Ratios: 79 % — ABNORMAL HIGH (ref 10.4–31.8)
TIBC: 399 ug/dL (ref 250–450)
UIBC: 83 ug/dL

## 2023-06-11 LAB — FERRITIN: Ferritin: 330 ng/mL — ABNORMAL HIGH (ref 11–307)

## 2023-06-15 ENCOUNTER — Inpatient Hospital Stay (HOSPITAL_BASED_OUTPATIENT_CLINIC_OR_DEPARTMENT_OTHER): Payer: BC Managed Care – PPO | Admitting: Internal Medicine

## 2023-06-15 DIAGNOSIS — R7989 Other specified abnormal findings of blood chemistry: Secondary | ICD-10-CM

## 2023-06-15 NOTE — Progress Notes (Signed)
[facility]  Telephone:([phone] [phone]  ID: Katherine Roan OB: 05/20/2001  MR#: [id number]  YQI#:[id number]  Patient Care Team: Cyndia Diver, PA-C as PCP - General (Family Medicine) Michaelyn Barter, MD as Consulting Physician (Oncology)  REASON FOR REFERRAL: elevated ferritin  HPI: Sydney Reyes is a 22 y.o. female with past medical history of dysautonomia on salt tablet and fludrocortisone, Ehlers-Danlos syndrome, anxiety, ADHD was referred to hematology for elevated ferritin.  Patient reports about 2 years ago she had her ferritin level checked which was elevated to 800 with saturation about 60%.  At that time, had Anamarie test which showed abnormal copies for hemochromatosis.   Had repeat levels on 03/23/2023.  Iron 259, saturation 67%, ferritin 382.  WBC 6.9, hemoglobin 14.2 and platelet 308.  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  Patient reports her mother has anemia and father went for iron level testing which came within the normal limit.  Reports history of heart failure in paternal grandfather in 2000.  Of unknown etiology.   Interval history Patient was seen today as follow-up to discuss labs Reports feeling well overall.  Denies any new signs or symptoms.  She gets periods once or twice a year and they are heavy and lasts about 2 weeks..  Reports that with her history of Ehlers-Danlos syndrome her periods are very heavy and painful and really compromises her quality of life.  So she has to be on birth control.  REVIEW OF SYSTEMS:   ROS  As per HPI. Otherwise, a complete review of systems is negative.  PAST MEDICAL HISTORY: Past Medical History:  Diagnosis Date   ADHD    Anxiety    Asthma    Avoidant/restrictive food intake disorder    Dysautonomia (HCC)    Generalized hypermobility of joints    OCD (obsessive compulsive disorder)     PAST SURGICAL HISTORY: Past Surgical History:  Procedure Laterality  Date   WISDOM TOOTH EXTRACTION      FAMILY HISTORY: Family History  Problem Relation Age of Onset   ADD / ADHD Father    Anxiety disorder Father    Depression Father    Drug abuse Maternal Grandfather    Ovarian cysts Maternal Grandmother    Stroke Paternal Grandfather    Dementia Paternal Great-grandfather    Cancer Paternal Great-grandfather    Breast cancer Maternal Great-grandfather    Heart attack Maternal Great-grandfather    Sudden Cardiac Death Neg Hx     HEALTH MAINTENANCE: Social History   Tobacco Use   Smoking status: Never   Smokeless tobacco: Never  Vaping Use   Vaping status: Never Used  Substance Use Topics   Alcohol use: Yes    Comment: rarely   Drug use: No    Comment: uses CBD Gummies     Allergies  Allergen Reactions   Prednisone Other (See Comments)    Dizziness/fainting   Gluten Meal Other (See Comments)    Current Outpatient Medications  Medication Sig Dispense Refill   albuterol (VENTOLIN HFA) 108 (90 Base) MCG/ACT inhaler SMARTSIG:2 Puff(s) By Mouth Every 6 Hours PRN     ALPRAZolam (XANAX) 0.5 MG tablet Take 0.5 mg by mouth daily as needed.     busPIRone (BUSPAR) 5 MG tablet Take 1 tablet (5 mg total) by mouth 2 (two) times daily. 60 tablet 1   cyanocobalamin 1000 MCG tablet Take 1,000 mcg by mouth daily.     escitalopram (LEXAPRO) 10 MG tablet Take 10 mg by  mouth daily.     fludrocortisone (FLORINEF) 0.1 MG tablet Take 1 tablet (0.1 mg) by mouth twice daily (Patient taking differently: daily. Take 1 tablet (0.1 mg) by mouth) 180 tablet 0   loratadine (CLARITIN) 10 MG tablet Take 10 mg by mouth daily.     magnesium 30 MG tablet Take 30 mg by mouth daily.     Melatonin 10 MG TABS Take 1 tablet by mouth at bedtime as needed.     montelukast (SINGULAIR) 10 MG tablet Take 10 mg by mouth daily.     norethindrone-ethinyl estradiol-FE (LOESTRIN FE) 1-20 MG-MCG tablet Take 1 tablet by mouth daily.     ondansetron (ZOFRAN-ODT) 4 MG disintegrating  tablet Take 4 mg by mouth every 8 (eight) hours as needed.     traZODone (DESYREL) 50 MG tablet Take 50-150 mg by mouth at bedtime.     No current facility-administered medications for this visit.    OBJECTIVE: Vitals:   06/15/23 1314  BP: 106/73  Pulse: 93  Temp: (!) 97.1 F (36.2 C)  SpO2: 100%     Body mass index is 20.52 kg/m.      General: Well-developed, well-nourished, no acute distress. Eyes: Pink conjunctiva, anicteric sclera. HEENT: Normocephalic, moist mucous membranes, clear oropharnyx. Lungs: Clear to auscultation bilaterally. Heart: Regular rate and rhythm. No rubs, murmurs, or gallops. Abdomen: Soft, nontender, nondistended. No organomegaly noted, normoactive bowel sounds. Musculoskeletal: No edema, cyanosis, or clubbing. Neuro: Alert, answering all questions appropriately. Cranial nerves grossly intact. Skin: No rashes or petechiae noted. Psych: Normal affect. Lymphatics: No cervical, calvicular, axillary or inguinal LAD.   LAB RESULTS:  Lab Results  Component Value Date   NA 137 06/11/2023   K 3.8 06/11/2023   CL 104 06/11/2023   CO2 24 06/11/2023   GLUCOSE 159 (H) 06/11/2023   BUN 11 06/11/2023   CREATININE 0.75 06/11/2023   CALCIUM 9.0 06/11/2023   PROT 7.3 06/11/2023   ALBUMIN 4.0 06/11/2023   AST 20 06/11/2023   ALT 15 06/11/2023   ALKPHOS 34 (L) 06/11/2023   BILITOT 0.6 06/11/2023   GFRNONAA >60 06/11/2023    Lab Results  Component Value Date   WBC 6.1 06/11/2023   NEUTROABS 3.6 06/11/2023   HGB 13.6 06/11/2023   HCT 40.2 06/11/2023   MCV 93.7 06/11/2023   PLT 284 06/11/2023    Lab Results  Component Value Date   TIBC 399 06/11/2023   TIBC 419 03/31/2023   FERRITIN 330 (H) 06/11/2023   FERRITIN 461 (H) 03/31/2023   IRONPCTSAT 79 (H) 06/11/2023   IRONPCTSAT 69 (H) 03/31/2023     STUDIES: No results found.  ASSESSMENT AND PLAN:   Gavrielle Neves is a 22 y.o. female with pmh of dysautonomia on salt tablet and  fludrocortisone, Ehlers-Danlos syndrome, anxiety, ADHD was referred to hematology for elevated ferritin.  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  WBC 6.9, hemoglobin 14.2 and platelet 308. -Ultrasound abdomen was normal.  ESR and CRP normal.  -Gene panel positive for homozygous C282Y mutation consistent with hereditary hemochromatosis.  -Patient is unable to tolerate any phlebotomies with her history of POTS and dysautonomia.    -We discussed in length about alternative options such as iron chelation with Exjade/Jadenu.  Side effects such as liver dysfunction, renal dysfunction, GI symptoms, rash, ophthalmologic evaluation was discussed.  Currently, she is maintaining her ferritin level between 300-400.  She is not giving me strong family history of hereditary  hemochromatosis or any complications.  Although she has homozygous C282Y mutation, the clinical penetrance can be low.  And with her negative family history there is less chance of her having complications.  Considering the risk versus benefit of her mildly elevated ferritin and side effects from iron chelation, we agreed that her levels can be closely monitored at this time.  And if they continue to go up, there could be a consideration of iron chelation therapy.  She will come in 3 months for labs only and I will follow-up with her in 6 months.  After that, she will be going back to [place] which is her home.  Her mother is already working on finding a Acupuncturist for her in [place].  Advised not to take any multivitamins with iron.  She watches her diet and avoids excessive intake of food highly rich in iron.   Orders Placed This Encounter  Procedures   CBC with Differential/Platelet   CMP (Cancer Center only)   Ferritin   Iron and TIBC([facility])   3 months labs only 6 months for MD visit, labs 2 to 3 days prior.   Patient expressed understanding and was  in agreement with this plan. She also understands that She can call clinic at any time with any questions, concerns, or complaints.   I spent a total of 30 minutes reviewing chart data, face-to-face evaluation with the patient, counseling and coordination of care as detailed above.  Michaelyn Barter, MD   06/15/2023 3:21 PM

## 2023-06-21 LAB — URINE DRUGS OF ABUSE SCREEN W ALC, ROUTINE (REF LAB)
Amphetamines, Urine: NEGATIVE ng/mL
Barbiturate Quant, Ur: NEGATIVE ng/mL
Benzodiazepine Quant, Ur: NEGATIVE ng/mL
Cocaine (Metab.): NEGATIVE ng/mL
Methadone Screen, Urine: NEGATIVE ng/mL
Methadone Screen, Urine: NEGATIVE %
Opiate Quant, Ur: NEGATIVE ng/mL
PCP Quant, Ur: NEGATIVE ng/mL
Propoxyphene: NEGATIVE ng/mL

## 2023-06-21 LAB — PANEL 799049
CARBOXY THC GC/MS CONF: 30 ng/mL
Cannabinoid GC/MS, Ur: POSITIVE — AB

## 2023-06-24 ENCOUNTER — Encounter: Payer: Self-pay | Admitting: Psychiatry

## 2023-06-24 ENCOUNTER — Telehealth: Payer: BC Managed Care – PPO | Admitting: Psychiatry

## 2023-06-24 DIAGNOSIS — F633 Trichotillomania: Secondary | ICD-10-CM | POA: Diagnosis not present

## 2023-06-24 DIAGNOSIS — Z8659 Personal history of other mental and behavioral disorders: Secondary | ICD-10-CM

## 2023-06-24 DIAGNOSIS — F341 Dysthymic disorder: Secondary | ICD-10-CM

## 2023-06-24 DIAGNOSIS — F411 Generalized anxiety disorder: Secondary | ICD-10-CM | POA: Diagnosis not present

## 2023-06-24 DIAGNOSIS — F429 Obsessive-compulsive disorder, unspecified: Secondary | ICD-10-CM

## 2023-06-24 DIAGNOSIS — F410 Panic disorder [episodic paroxysmal anxiety] without agoraphobia: Secondary | ICD-10-CM

## 2023-06-24 MED ORDER — TRAZODONE HCL 50 MG PO TABS
50.0000 mg | ORAL_TABLET | Freq: Every day | ORAL | 1 refills | Status: DC
Start: 2023-06-24 — End: 2024-02-22

## 2023-06-24 MED ORDER — ESCITALOPRAM OXALATE 5 MG PO TABS
5.0000 mg | ORAL_TABLET | Freq: Every day | ORAL | 0 refills | Status: DC
Start: 2023-06-24 — End: 2023-09-03

## 2023-06-24 MED ORDER — ALPRAZOLAM 0.5 MG PO TABS: 0.5000 mg | ORAL_TABLET | ORAL | 0 refills | Status: AC

## 2023-06-24 NOTE — Progress Notes (Signed)
Virtual Visit via Video Note  I connected with Sydney Reyes on 06/24/23 at  4:30 PM EST by a video enabled telemedicine application and verified that I am speaking with the correct person using two identifiers.  Location Provider Location : [facility] Patient Location : Home  Participants: Patient , Provider   I discussed the limitations of evaluation and management by telemedicine and the availability of in person appointments. The patient expressed understanding and agreed to proceed.    I discussed the assessment and treatment plan with the patient. The patient was provided an opportunity to ask questions and all were answered. The patient agreed with the plan and demonstrated an understanding of the instructions.   The patient was advised to call back or seek an in-person evaluation if the symptoms worsen or if the condition fails to improve as anticipated.    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  Patient presented for a follow-up appointment.  The patient, with a history of generalized anxiety, OCD, depression, trichotillomania, and ADHD, reports a period of increased stress due to upcoming school finals. She notes that her anxiety is situational and expects it to improve once finals are over. She has been managing her anxiety with therapy and CBD oil, which she has been using since the age of 48.  She was initiated on BuSpar last visit to address her anxiety and depression however it caused significant sleep problems.  She reports that her  sleep has improved since discontinuing Buspar.The patient also takes trazodone for sleep, which she reports helps her, but she still struggles with falling asleep and often does not fall asleep until around midnight. She typically sleeps until around 10 AM or noon if she does not have any obligations. She has been trying various strategies to improve her sleep, including reading, listening to podcasts, and meditation.  She also takes melatonin and magnesium at night which also helps with sleep.  The patient also reports struggling with a low appetite and nausea when trying to eat larger meals. She is working with a dietician to manage this issue. She also takes sodium supplements to manage symptoms related to her diagnosis of Ehlers-Danlos syndrome and POTS.  Overall she has been eating enough for herself.  Regarding her trichotillomania, the patient reports a recent flare-up about a week and a half ago, which lasted for about 30-45 minutes. She has been managing this condition with therapy.  The patient also reports struggling with feelings of being a failure which comes and goes.  Patient also reports having trouble concentrating, which she attributes to her ADHD. She notes that she cannot be medicated for ADHD due to her heart condition.  The patient has been taking Lexapro for her mental health conditions and has been tolerating it well. She expresses a willingness to try increasing the dosage of Lexapro when she is home with her mother. She also has a prescription for Xanax, which she uses sparingly, only when experiencing a panic attack or severe anxiety. She reports using Xanax no more than once  a month, and sometimes going several months without needing it.    Visit Diagnosis:    ICD-10-CM   1. GAD (generalized anxiety disorder)  F41.1 escitalopram (LEXAPRO) 5 MG tablet    ALPRAZolam (XANAX) 0.5 MG tablet    2. Obsessive-compulsive disorder with good or fair insight  F42.9 escitalopram  (LEXAPRO) 5 MG tablet    traZODone (DESYREL) 50 MG tablet    3. 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 Panic attacks  F41.0     6. History of ADHD  Z86.59       Past Psychiatric History: I have reviewed past psychiatric history from progress note on 05/28/2023.  Past trials of medications like Pristiq, Strattera, Wellbutrin, fluvoxamine, Adderall, BuSpar and several others.  Past Medical History:  Past Medical History:  Diagnosis Date   ADHD    Anxiety    Asthma    Avoidant/restrictive food intake disorder    Dysautonomia (HCC)    Generalized hypermobility of joints    OCD (obsessive compulsive disorder)     Past Surgical History:  Procedure Laterality Date   WISDOM TOOTH EXTRACTION      Family Psychiatric History: Reviewed family psychiatric history from progress note on 05/28/2023.  Family History:  Family History  Problem Relation Age of Onset   ADD / ADHD Father    Anxiety disorder Father    Depression Father    Drug abuse Maternal Grandfather    Ovarian cysts Maternal Grandmother    Stroke Paternal Grandfather    Dementia Paternal Great-grandfather    Cancer Paternal Great-grandfather    Breast cancer Maternal Great-grandfather    Heart attack Maternal Great-grandfather    Sudden Cardiac Death Neg Hx     Social History: Reviewed social history from progress note on 05/28/2023. Social History   Socioeconomic History   Marital status: Single    Spouse name: Not on file   Number of children: Not on file   Years of education: Not on file   Highest education level: Some college, no degree  Occupational History   Not on file  Tobacco Use   Smoking status: Never   Smokeless tobacco: Never  Vaping Use   Vaping status: Never Used  Substance and Sexual Activity   Alcohol use:  Yes    Comment: rarely   Drug use: No    Comment: uses CBD Gummies   Sexual activity: Yes    Birth control/protection: None  Other Topics Concern   Not on file  Social History Narrative   ** Merged History Encounter **       Social Determinants of Health   Financial Resource Strain: Patient Declined (03/23/2023)   Received [facility]   Overall Financial Resource Strain (CARDIA)    Difficulty of Paying Living Expenses: Patient declined  Food Insecurity: Patient Declined (03/23/2023)   [facility]   Hunger Vital Sign    Worried About Running Out of Food in the Last Year: Patient declined    Ran Out of Food in the Last Year: Patient declined  Transportation Needs: Patient Declined (03/23/2023)   Received from [facility] - Transportation    In the past 12 months, has lack of transportation kept you from medical appointments or  from getting medications?: Patient declined    Lack of Transportation (Non-Medical): Patient declined  Physical Activity: Sufficiently Active (03/20/2021)   Received from [facility]   Exercise Vital Sign    Days of Exercise per Week: 5 days    Minutes of Exercise per Session: 80 min  Stress: Stress Concern Present (03/20/2021)   Received from [facility] Occupational [facility] - Occupational Stress Questionnaire    Feeling of Stress : Very much  Social Connections: Unknown (12/02/2021)   Received from [facility]   Social Network    Social Network: Not on file    Allergies:  Allergies  Allergen Reactions   Prednisone Other (See Comments)    Dizziness/fainting   Gluten Meal Other (See Comments)    Metabolic Disorder Labs: No results found for: "HGBA1C", "MPG" No results found for: "PROLACTIN" No results found for: "CHOL", "TRIG", "HDL", "CHOLHDL", "VLDL", "LDLCALC" No results found for:  "TSH"  Therapeutic Level Labs: No results found for: "LITHIUM" No results found for: "VALPROATE" No results found for: "CBMZ"  Current Medications: Current Outpatient Medications  Medication Sig Dispense Refill   albuterol (VENTOLIN HFA) 108 (90 Base) MCG/ACT inhaler SMARTSIG:2 Puff(s) By Mouth Every 6 Hours PRN     cyanocobalamin 1000 MCG tablet Take 1,000 mcg by mouth daily.     escitalopram (LEXAPRO) 10 MG tablet Take 10 mg by mouth daily.     escitalopram (LEXAPRO) 5 MG tablet Take 1 tablet (5 mg total) by mouth daily. Take along with Lexapro 10 mg daily 30 tablet 0   fludrocortisone (FLORINEF) 0.1 MG tablet Take 1 tablet (0.1 mg) by mouth twice daily (Patient taking differently: daily. Take 1 tablet (0.1 mg) by mouth) 180 tablet 0   loratadine (CLARITIN) 10 MG tablet Take 10 mg by mouth daily.     magnesium 30 MG tablet Take 30 mg by mouth daily.     Melatonin 10 MG TABS Take 1 tablet by mouth at bedtime as needed.     montelukast (SINGULAIR) 10 MG tablet Take 10 mg by mouth daily.     norethindrone-ethinyl estradiol-FE (LOESTRIN FE) 1-20 MG-MCG tablet Take 1 tablet by mouth daily.     ondansetron (ZOFRAN-ODT) 4 MG disintegrating tablet Take 4 mg by mouth every 8 (eight) hours as needed.     ALPRAZolam (XANAX) 0.5 MG tablet Take 1 tablet (0.5 mg total) by mouth as directed. Takes it once a month as needed for severe anxiety attacks 10 tablet 0   traZODone (DESYREL) 50 MG tablet Take 1-3 tablets (50-150 mg total) by mouth at bedtime. 270 tablet 1   No current facility-administered medications for this visit.     Musculoskeletal: Strength & Muscle Tone:  UTA Gait & Station:  Seated Patient leans: N/A  Psychiatric Specialty Exam: Review of Systems  Psychiatric/Behavioral:  Positive for decreased concentration, dysphoric mood and sleep disturbance. The patient is nervous/anxious.     There were no vitals taken for this visit.There is no height or weight on file to calculate BMI.   General Appearance: Fairly Groomed  Eye Contact:  Fair  Speech:  Clear and Coherent  Volume:  Normal  Mood:  Anxious and Depressed  Affect:  Congruent  Thought Process:  Goal Directed and Descriptions of Associations: Intact  Orientation:  Full (Time, Place, and Person)  Thought Content: Logical   Suicidal Thoughts:  No  Homicidal Thoughts:  No  Memory:  Immediate;  Fair Recent;   Fair Remote;   Fair  Judgement:  Fair  Insight:  Fair  Psychomotor Activity:  Normal  Concentration:  Concentration: Fair and Attention Span: Fair  Recall:  [employer] of Knowledge: Fair  Language: Fair  Akathisia:  No  Handed:  Right  AIMS (if indicated): not done  Assets:  Communication Skills Desire for Improvement Housing Social Support  ADL's:  Intact  Cognition: WNL  Sleep:   Difficulty falling asleep however overall once she is able to fall asleep has been sleeping okay   Screenings: GAD-7    Flowsheet Row Office Visit from 05/28/2023 in [facility]  Total GAD-7 Score 21      PHQ2-9    Flowsheet Row Video Visit from 06/24/2023 in [facility] Office Visit from 05/28/2023 in [facility]  PHQ-2 Total Score 4 4  PHQ-9 Total Score 10 20      Flowsheet Row Video Visit from 06/24/2023 in [facility] Office Visit from 05/28/2023 in [facility]  C-SSRS RISK CATEGORY No Risk No Risk        Assessment and Plan: Sydney Reyes is a 22 year old Caucasian female with a history of generalized anxiety disorder, depression, OCD, trichotillomania, currently a Consulting civil engineer, senior at [employer], currently on psychotropics medications including Lexapro, recent side effects to BuSpar, continues to benefit from medication readjustment due to current symptoms as noted above, plan as noted  below.  Plan  Generalized Anxiety Disorder (GAD)-some improvement Situational anxiety related to school finals. Previously discontinued Buspar due to severe insomnia. Currently managing anxiety well and open to increasing Lexapro dosage. Discussed risks and benefits of increasing Lexapro, including potential side effects and improved anxiety control. Prefers Lexapro due to minimal side effects experienced. - Send Lexapro 5 mg to [facility] with 2.5 mg (half of 5 mg) along with Lexapro 10 mg for 2-3 weeks, then increase to 5 mg, total of Lexapro 15 mg if no adverse effects - Provide 30-day supply of Lexapro 5 mg  Persistent depressive disorder, pure dysthymic episodes-unstable Ongoing depressive symptoms, including lack of interest, feeling down, and sleep disturbances. Current symptoms managed but still present. Discussed potential benefits of increasing Lexapro dosage and non-pharmacological interventions. - Continue current therapy sessions - Difficulty falling asleep, taking about two hours despite various interventions. Identified sleep-wake cycle issue. Discussed sleep restriction techniques as a potential intervention. - Recommend sleep restriction techniques - Continue Trazodone 50-150 mg p.o. nightly as well as melatonin over-the-counter.   Obsessive-Compulsive Disorder (OCD)-improving - Continue Lexapro, dosage increase to 15 mg p.o. daily gradually. - Continue therapy sessions.  Trichotillomania-improving Hair-pulling behavior managed through therapy. Recent flare-up but working with therapist. Ongoing therapy as primary management strategy. - Continue therapy sessions  Panic Attacks-chronic with episodic flareup Uses Xanax sparingly for severe anxiety or panic attacks. History of positive THC tests due to CBD use, complicating Xanax prescription. Discussed risks of Xanax, including potential for habit formation and policy on controlled substances. Agreed on limited  prescription of Xanax with random drug tests. - Prescribe 10 pills of Xanax for emergency use - Send Xanax prescription to [facility] [address] - Conduct random drug tests -Reviewed and discussed most recent urine drug screen-dated 06/18/2023-cannabinoids positive, THC GC/MS-30   Attention-Deficit/Hyperactivity Disorder (ADHD)-per history Struggles with concentration but cannot be medicated for ADHD due to heart issues. Symptoms not well managed. Discussed  non-pharmacological strategies for managing ADHD symptoms. - Implement non-pharmacological strategies for ADHD management  Patient with Ehlers-Danlos Syndrome prone to side effects from medications. Prefers Lexapro due to minimal side effects. Discussed careful monitoring for side effects with any new medications. - Monitor for side effects with any new medications   Follow-up - Schedule follow-up video visit on February 6th at 3 PM or sooner if needed.   Collaboration of Care: Collaboration of Care: Referral or follow-up with counselor/therapist AEB patient encouraged to continue CBT  Patient/Guardian was advised Release of Information must be obtained prior to any record release in order to collaborate their care with an outside provider. Patient/Guardian was advised if they have not already done so to contact the registration department to sign all necessary forms in order for [place] to release information regarding their care.   Consent: Patient/Guardian gives verbal consent for treatment and assignment of benefits for services provided during this visit. Patient/Guardian expressed understanding and agreed to proceed.   This note was generated in part or whole with voice recognition software. Voice recognition is usually quite accurate but there are transcription errors that can and very often do occur. I apologize for any typographical errors that were not detected and corrected.    Jomarie Longs, MD 06/24/2023, 4:47 PM

## 2023-07-06 ENCOUNTER — Telehealth: Payer: Self-pay

## 2023-07-06 DIAGNOSIS — F411 Generalized anxiety disorder: Secondary | ICD-10-CM

## 2023-07-06 MED ORDER — ESCITALOPRAM OXALATE 10 MG PO TABS
10.0000 mg | ORAL_TABLET | Freq: Every day | ORAL | 1 refills | Status: DC
Start: 2023-07-06 — End: 2023-09-03

## 2023-07-06 NOTE — Telephone Encounter (Signed)
received fax requesting a refill on the the lexapro 10mg . pt was last seen on 11-27 next appt 2-6

## 2023-07-06 NOTE — Telephone Encounter (Signed)
I have sent Lexapro 10 mg to pharmacy at [facility].

## 2023-07-08 NOTE — Telephone Encounter (Signed)
pt had already been called and left a message that rx was sent to the [facility] pharmacy.

## 2023-08-03 ENCOUNTER — Other Ambulatory Visit: Payer: Self-pay | Admitting: Internal Medicine

## 2023-08-04 MED ORDER — FLUDROCORTISONE ACETATE 0.1 MG PO TABS: ORAL_TABLET | ORAL | 1 refills | Status: AC

## 2023-08-04 NOTE — Telephone Encounter (Signed)
 Please advise on Florinef refill request.  Last office visit: 04/02/23 with plan to f/u in 6 months   Next visit: none/active recall

## 2023-08-18 ENCOUNTER — Encounter: Payer: Self-pay | Admitting: Internal Medicine

## 2023-09-03 ENCOUNTER — Telehealth (INDEPENDENT_AMBULATORY_CARE_PROVIDER_SITE_OTHER): Payer: Self-pay | Admitting: Psychiatry

## 2023-09-03 ENCOUNTER — Encounter: Payer: Self-pay | Admitting: Psychiatry

## 2023-09-03 DIAGNOSIS — F429 Obsessive-compulsive disorder, unspecified: Secondary | ICD-10-CM | POA: Diagnosis not present

## 2023-09-03 DIAGNOSIS — F633 Trichotillomania: Secondary | ICD-10-CM

## 2023-09-03 DIAGNOSIS — F341 Dysthymic disorder: Secondary | ICD-10-CM

## 2023-09-03 DIAGNOSIS — F411 Generalized anxiety disorder: Secondary | ICD-10-CM

## 2023-09-03 DIAGNOSIS — Z8659 Personal history of other mental and behavioral disorders: Secondary | ICD-10-CM

## 2023-09-03 DIAGNOSIS — F410 Panic disorder [episodic paroxysmal anxiety] without agoraphobia: Secondary | ICD-10-CM

## 2023-09-03 MED ORDER — ESCITALOPRAM OXALATE 20 MG PO TABS: 20.0000 mg | ORAL_TABLET | Freq: Every day | ORAL | 1 refills | Status: DC

## 2023-09-03 NOTE — Patient Instructions (Signed)
 Atomoxetine Capsules What is this medication? ATOMOXETINE (AT oh mox e teen) treats attention-deficit hyperactivity disorder (ADHD). It works by improving focus and reducing impulsive behavior. It belongs to a group of medications called selective norepinephrine reuptake inhibitors. This medicine may be used for other purposes; ask your health care provider or pharmacist if you have questions. COMMON BRAND NAME(S): Strattera What should I tell my care team before I take this medication? They need to know if you have any of these conditions: Glaucoma High or low blood pressure History of stroke Irregular heartbeat or other cardiac disease Liver disease Mania or bipolar disorder Pheochromocytoma Suicidal thoughts, plans, or attempt by you or a family member An unusual or allergic reaction to atomoxetine, other medications, foods, dyes, or preservatives Pregnant or trying to get pregnant Breastfeeding How should I use this medication? Take this medication by mouth with water. Take it as directed on the prescription label at the same time every day. Do not cut, crush, or chew this medication. Swallow the capsules whole. You can take it with or without food. If it upsets your stomach, take it with food. If you have difficulty sleeping, and you take more than 1 dose per day, take your last dose before 6 PM. Keep taking it unless your care team tells you to stop. A special MedGuide will be given to you by the pharmacist with each prescription and refill. Be sure to read this information carefully each time. Talk to your care team about the use of this medication in children. While it may be prescribed for children as young as 6 years for selected conditions, precautions do apply. Overdosage: If you think you have taken too much of this medicine contact a poison control center or emergency room at once. NOTE: This medicine is only for you. Do not share this medicine with others. What if I miss a  dose? If you miss a dose, take it as soon as you can. If it is almost time for your next dose, take only that dose. Do not take double or extra doses. What may interact with this medication? Do not take this medication with any of the following: Cisapride Dronedarone MAOIs, such as Carbex, Eldepryl, Marplan, Nardil, and Parnate Pimozide Reboxetine Thioridazine This medication may also interact with the following: Certain medications for blood pressure, heart disease, irregular heart beat Certain medications for lung disease, such as albuterol Certain medications for mental heath conditions Cold or allergy medications Dofetilide Fluoxetine Medications that increase blood pressure, such as dopamine, dobutamine, or ephedrine Other medications that cause heart rhythm changes Paroxetine Quinidine Stimulant medications for ADHD, weight loss, or staying awake Ziprasidone This list may not describe all possible interactions. Give your health care provider a list of all the medicines, herbs, non-prescription drugs, or dietary supplements you use. Also tell them if you smoke, drink alcohol, or use illegal drugs. Some items may interact with your medicine. What should I watch for while using this medication? Visit your care team for regular checks on your progress. It may take a week or more before you see the benefit from this medication. This is why it is very important to continue taking the medication and not miss any doses. If you have been taking this medication regularly for some time, do not suddenly stop taking it. Ask your care team for advice. Rarely, this medication may increase thoughts of suicide or suicide attempts in children and teenagers. Call your child's care team right away if your child or  teenager has new or increased thoughts of suicide or has changes in mood or behavior like becoming irritable or anxious. Regularly monitor your child for these behavioral changes. Contact you  care team right away if you have an erection that lasts longer than 4 hours or if it becomes painful. This may be a sign of serious problem and must be treated right away to prevent permanent damage. This medication may affect your coordination, reaction time, or judgment. Do not drive or operate machinery until you know how this medication affects you. Sit up or stand slowly to reduce the risk of dizzy or fainting spells. Drinking alcohol with this medication can increase the risk of these side effects. Do not treat yourself for coughs, colds, or allergies without asking your care team for advice. Some ingredients can increase possible side effects. Your mouth may get dry. Chewing sugarless gum or sucking hard candy and drinking plenty of water will help. What side effects may I notice from receiving this medication? Side effects that you should report to your care team as soon as possible: Allergic reactions--skin rash, itching, hives, swelling of the face, lips, tongue, or throat Heart rhythm changes--fast or irregular heartbeat, dizziness, feeling faint or lightheaded, chest pain, trouble breathing Increase in blood pressure Liver injury-- right upper belly pain, loss of appetite, nausea, light-colored stool, dark yellow or brown urine, yellowing skin or eyes, unusual weakness or fatigue Mood and behavior changes--anxiety, nervousness, confusion, hallucinations, irritability, hostility, thoughts of suicide or self-harm, worsening mood, feelings of depression Painful or prolonged erection Stroke in adults--sudden numbness or weakness of the face, arm, or leg, trouble speaking, confusion, trouble walking, loss of balance or coordination, dizziness, severe headache, change in vision Trouble passing urine Side effects that usually do not require medical attention (report to your care team if they continue or are bothersome): Change in sex drive or performance Constipation Dizziness Dry mouth Loss  of appetite Nausea Stomach pain Vomiting This list may not describe all possible side effects. Call your doctor for medical advice about side effects. You may report side effects to FDA at 1-800-FDA-1088. Where should I keep my medication? Keep out of the reach of children and pets. Store at room temperature between 15 and 30 degrees C (59 and 86 degrees F). Throw away any unused medication after the expiration date. NOTE: This sheet is a summary. It may not cover all possible information. If you have questions about this medicine, talk to your doctor, pharmacist, or health care provider.  2024 Elsevier/Gold Standard (2023-06-26 00:00:00)

## 2023-09-03 NOTE — Progress Notes (Signed)
 Virtual Visit via Video Note  I connected with Sydney Reyes on 09/03/23 at  3:00 PM EST by a video enabled telemedicine application and verified that I am speaking with the correct person using two identifiers.  Location Provider Location : [facility] Patient Location : Home  Participants: Patient , Provider   I discussed the limitations of evaluation and management by telemedicine and the availability of in person appointments. The patient expressed understanding and agreed to proceed.    I discussed the assessment and treatment plan with the patient. The patient was provided an opportunity to ask questions and all were answered. The patient agreed with the plan and demonstrated an understanding of the instructions.   The patient was advised to call back or seek an in-person evaluation if the symptoms worsen or if the condition fails to improve as anticipated.   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  Generalized anxiety disorder and OCD are being managed with Lexapro , which was recently increased to 15 mg. There is noticeable improvement in OCD symptoms, which was unexpected as previous treatments were ineffective. She describes being more flexible and less rigid, as noted by friends.  Trichotillomania has been less frequent since the increase in Lexapro  dosage. She pulls hair from various areas, including eyelashes, eyebrows, and pubic hair.  ADHD symptoms are severe, but she cannot take stimulants due to POTS, which cause her heart rate to increase to 200  bpm. She is exploring non-stimulant options but is cautious due to potential side effects and interactions with current medications.  Significant weight loss of 15 pounds since the end of November is reported, with current weight at 101 pounds. She experiences nausea and early satiety, leading to eating small amounts throughout the day. She suspects gastroparesis and has seen her primary care provider, who agrees and is trying to expedite a GI consultation. She has a history of an eating disorder but believes the current issue is not mental health-related.  She is currently in therapy, seeing a therapist every other week and a dietician on alternate weeks. She reports situational depression due to a recent breakup but is managing it with support from her roommate, who is also a psychology major.   Denies any suicidality or homicidality or perceptual disturbances.   Visit Diagnosis:    ICD-10-CM   1. GAD (generalized anxiety disorder)  F41.1 escitalopram  (LEXAPRO ) 20 MG tablet    2. 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 Panic attacks  F41.0 escitalopram  (LEXAPRO ) 20 MG tablet    6. History of ADHD  Z86.59       Past Psychiatric History: I have reviewed past psychiatric history from progress note on 05/28/2023.  Past trials of medications like Pristiq, Strattera, Wellbutrin, fluvoxamine, Adderall, BuSpar , several others.  Past Medical History:  Past Medical History:  Diagnosis Date   ADHD    Anxiety    Asthma    Avoidant/restrictive food intake disorder    Dysautonomia (HCC)    Generalized hypermobility of joints    OCD (obsessive compulsive disorder)     Past Surgical History:  Procedure Laterality Date    WISDOM TOOTH EXTRACTION      Family Psychiatric History: I have reviewed family psychiatric history from progress note on 05/28/2023.  Family History:  Family History  Problem Relation Age of Onset   ADD / ADHD Father    Anxiety disorder Father    Depression Father    Drug abuse Maternal Grandfather    Ovarian cysts Maternal Grandmother    Stroke Paternal Grandfather    Dementia Paternal Great-grandfather    Cancer Paternal Great-grandfather    Breast cancer Maternal Great-grandfather    Heart attack Maternal Great-grandfather    Sudden Cardiac Death Neg Hx     Social History: I have reviewed social history from progress note on 05/28/2023. Social History   Socioeconomic History   Marital status: Single    Spouse name: Not on file   Number of children: Not on file   Years of education: Not on file   Highest education level: Some college, no degree  Occupational History   Not on file  Tobacco Use   Smoking status: Never   Smokeless tobacco: Never  Vaping Use   Vaping status: Never Used  Substance and Sexual Activity   Alcohol use: Yes    Comment: rarely   Drug use: No    Comment: uses CBD Gummies   Sexual activity: Yes    Birth control/protection: None  Other Topics Concern   Not on file  Social History Narrative   ** Merged History Encounter **       Social Drivers of Health   Financial Resource Strain: Patient Declined (03/23/2023)   Received from [facility]   Overall Financial Resource Strain (CARDIA)    Difficulty of Paying Living Expenses: Patient declined  Food Insecurity: No Food Insecurity (08/10/2023)   Received from [facility]   [facility] Food Insecurity    Worried About Programme Researcher, Broadcasting/film/video in the Last Year: Not on file    Ran Out of Food in the Last Year: Not on file    Within the past 12 months, the food you bought just didn't last and you didn't have money to get more.: Not on file    In the past 12 months, were you  worried that your food would run out before you got money to buy more or the food you bought just did not last and you did not have money to get more?: Not on file  Transportation Needs: No Transportation Needs (08/10/2023)   Received from [facility]   [facility] Transportation Needs    Lack of Transportation (Medical): Not on file    Lack of Transportation (Non-Medical): Not on file    Unmet Transportation Needs: Not on file    In the past 12 months, has lack of reliable transportation kept you from medical appointments, meetings, work or from getting to things needed for daily living?: Not on file  Physical Activity: Sufficiently Active (03/20/2021)   Received from [facility], [facility]   Exercise Vital Sign    Days of Exercise per Week: 5 days    Minutes of Exercise per Session: 80 min  Stress: Stress Concern Present (03/20/2021)   Received  from [facility], [facility]   [employer] of Occupational Health - Occupational Stress Questionnaire    Feeling of Stress : Very much  Social Connections: Unknown (12/02/2021)   Received from [facility], [facility]   Social Network    Social Network: Not on file    Allergies:  Allergies  Allergen Reactions   Prednisone Other (See Comments)    Dizziness/fainting   Gluten Meal Other (See Comments)    Metabolic Disorder Labs: No results found for: HGBA1C, MPG No results found for: PROLACTIN No results found for: CHOL, TRIG, HDL, CHOLHDL, VLDL, LDLCALC No results found for: TSH  Therapeutic Level Labs: No results found for: LITHIUM No results found for: VALPROATE No results found for: CBMZ  Current Medications: Current Outpatient Medications  Medication Sig Dispense Refill   albuterol (VENTOLIN HFA) 108 (90 Base) MCG/ACT inhaler SMARTSIG:2 Puff(s) By Mouth Every 6 Hours PRN     ALPRAZolam  (XANAX ) 0.5 MG tablet Take 1 tablet (0.5 mg total) by mouth as directed. Takes it once a month  as needed for severe anxiety attacks 10 tablet 0   cyanocobalamin 1000 MCG tablet Take 1,000 mcg by mouth daily.     escitalopram  (LEXAPRO ) 20 MG tablet Take 1 tablet (20 mg total) by mouth daily. 30 tablet 1   fludrocortisone  (FLORINEF ) 0.1 MG tablet Take 1 tablet (0.1 mg) by mouth twice daily 180 tablet 1   loratadine (CLARITIN) 10 MG tablet Take 10 mg by mouth daily.     magnesium 30 MG tablet Take 30 mg by mouth daily.     Melatonin 10 MG TABS Take 1 tablet by mouth at bedtime as needed.     montelukast (SINGULAIR) 10 MG tablet Take 10 mg by mouth daily.     norethindrone-ethinyl estradiol-FE (LOESTRIN FE) 1-20 MG-MCG tablet Take 1 tablet by mouth daily.     ondansetron (ZOFRAN-ODT) 4 MG disintegrating tablet Take 4 mg by mouth every 8 (eight) hours as needed.     traZODone  (DESYREL ) 50 MG tablet Take 1-3 tablets (50-150 mg total) by mouth at bedtime. 270 tablet 1   No current facility-administered medications for this visit.     Musculoskeletal: Strength & Muscle Tone:  UTA Gait & Station:  seated Patient leans: N/A  Psychiatric Specialty Exam: Review of Systems  Psychiatric/Behavioral:  The patient is nervous/anxious.     There were no vitals taken for this visit.There is no height or weight on file to calculate BMI.  General Appearance: Fairly Groomed  Eye Contact:  Fair  Speech:  Clear and Coherent  Volume:  Normal  Mood:  Anxious  Affect:  Appropriate  Thought Process:  Goal Directed and Descriptions of Associations: Intact  Orientation:  Full (Time, Place, and Person)  Thought Content: Logical   Suicidal Thoughts:  No  Homicidal Thoughts:  No  Memory:  Immediate;   Fair Recent;   Fair Remote;   Fair  Judgement:  Intact  Insight:  Good  Psychomotor Activity:  Normal  Concentration:  Concentration: Fair and Attention Span: Fair  Recall:  [employer] of Knowledge: Fair  Language: Fair  Akathisia:  No  Handed:  Right  AIMS (if indicated): not done  Assets:   Communication Skills Desire for Improvement Housing Resilience Social Support Talents/Skills Transportation  ADL's:  Intact  Cognition: WNL  Sleep:  Fair   Screenings: GAD-7    Garment/textile Technologist Visit from 05/28/2023 in [facility]  Total GAD-7 Score  21      PHQ2-9    Flowsheet Row Video Visit from 06/24/2023 in [facility] Office Visit from 05/28/2023 in [facility]  PHQ-2 Total Score 4 4  PHQ-9 Total Score 10 20      Flowsheet Row Video Visit from 09/03/2023 in [facility] Video Visit from 06/24/2023 in [facility] Office Visit from 05/28/2023 in [facility]  C-SSRS RISK CATEGORY No Risk No Risk No Risk        Assessment and Plan: Sydney Reyes is a 23 year old Caucasian female with a history of generalized anxiety disorder, depression, OCD, trichotillomania, currently a consulting civil engineer, senior at [employer] continues to have residual symptoms of anxiety, recent situational stressors including break-up as well as medical problems, discussed assessment and plan as noted below.  Generalized Anxiety Disorder/Panic attacks-some improvement Currently managed with Lexapro , recently increased to 15 mg with good tolerance. Reports significant improvement in OCD symptoms. No new depressive symptoms noted. Willing to increase to 20 mg despite previous severe depressive symptoms with higher doses in high school, feels comfortable increasing now with support from friends and monitoring. - Increase Lexapro  to 20 mg daily - Send prescription to [facility] - Xanax  0.5 mg short supply was provided previously for panic attacks. - Monitor for side effects, including GI issues and suicidal thoughts - Follow up in 2-2.5  months  Obsessive-Compulsive Disorder (OCD)-improving Significant improvement in OCD symptoms with Lexapro  15 mg. Increased flexibility and reduced rigidity in behavior noted by friends. - Continue Lexapro , increase to 20 mg daily - Monitor for side effects, including GI issues and suicidal thoughts - Follow up in 2-2.5 months  Trichotillomania-improving Less frequent hair-pulling behaviors since increasing Lexapro . Symptoms are more manageable but still present, affecting various body parts including eyelashes, eyebrows, and pubic hair. - Continue Lexapro , increase to 20 mg daily - Monitor for side effects, including GI issues and suicidal thoughts - Follow up in 2-2.5 months  History of Attention-Deficit/Hyperactivity Disorder (ADHD) Severe ADHD symptoms. Unable to take stimulants due to POTS. Discussed non-stimulant options like Wellbutrin, Strattera, and Intuniv. Advised to wait until GI issues are resolved before starting new medication. Prefers to wait until after current semester due to financial and logistical concerns. - Reassess ADHD treatment options in 3-4 weeks - Consider non-stimulant options like Strattera or Intuniv after GI evaluation - Follow up in 2-2.5 months  Suspected Gastroparesis Significant weight loss (15 pounds since November). Symptoms include nausea, early satiety, and inability to eat full meals. Awaiting urgent GI appointment. Reports difficulty eating full meals and significant nausea. - Await GI appointment - Monitor weight and nutritional intake - Consider supplementing with shakes as advised by dietician - Follow up with primary care and dietician  Follow-up - Schedule follow-up appointment for April 8th at 1 PM via video visit.   Collaboration of Care: Collaboration of Care: Referral or follow-up with counselor/therapist AEB encouraged to continue CBT encouraged to follow up with GI specialist.  Patient/Guardian was advised Release of Information  must be obtained prior to any record release in order to collaborate their care with an outside provider. Patient/Guardian was advised if they have not already done so to contact the registration department to sign all necessary forms in order for us  to release information regarding their care.   Consent: Patient/Guardian gives verbal consent for treatment and assignment of benefits for services provided during this  visit. Patient/Guardian expressed understanding and agreed to proceed.   This note was generated in part or whole with voice recognition software. Voice recognition is usually quite accurate but there are transcription errors that can and very often do occur. I apologize for any typographical errors that were not detected and corrected.    Sydney Render, MD 09/04/2023, 9:59 AM

## 2023-09-15 ENCOUNTER — Inpatient Hospital Stay: Payer: BC Managed Care – PPO | Attending: Internal Medicine

## 2023-09-15 DIAGNOSIS — R7989 Other specified abnormal findings of blood chemistry: Secondary | ICD-10-CM | POA: Insufficient documentation

## 2023-09-15 LAB — IRON AND TIBC
Iron: 239 ug/dL — ABNORMAL HIGH (ref 28–170)
Saturation Ratios: 55 % — ABNORMAL HIGH (ref 10.4–31.8)
TIBC: 434 ug/dL (ref 250–450)
UIBC: 195 ug/dL

## 2023-09-15 LAB — CBC WITH DIFFERENTIAL/PLATELET
Abs Immature Granulocytes: 0.01 10*3/uL (ref 0.00–0.07)
Basophils Absolute: 0 10*3/uL (ref 0.0–0.1)
Basophils Relative: 0 %
Eosinophils Absolute: 0.2 10*3/uL (ref 0.0–0.5)
Eosinophils Relative: 4 %
HCT: 39.3 % (ref 36.0–46.0)
Hemoglobin: 13.6 g/dL (ref 12.0–15.0)
Immature Granulocytes: 0 %
Lymphocytes Relative: 29 %
Lymphs Abs: 2 10*3/uL (ref 0.7–4.0)
MCH: 31.9 pg (ref 26.0–34.0)
MCHC: 34.6 g/dL (ref 30.0–36.0)
MCV: 92.3 fL (ref 80.0–100.0)
Monocytes Absolute: 0.4 10*3/uL (ref 0.1–1.0)
Monocytes Relative: 6 %
Neutro Abs: 4.1 10*3/uL (ref 1.7–7.7)
Neutrophils Relative %: 61 %
Platelets: 284 10*3/uL (ref 150–400)
RBC: 4.26 MIL/uL (ref 3.87–5.11)
RDW: 12.3 % (ref 11.5–15.5)
WBC: 6.7 10*3/uL (ref 4.0–10.5)
nRBC: 0 % (ref 0.0–0.2)

## 2023-09-15 LAB — CMP (CANCER CENTER ONLY)
ALT: 12 U/L (ref 0–44)
AST: 16 U/L (ref 15–41)
Albumin: 3.8 g/dL (ref 3.5–5.0)
Alkaline Phosphatase: 33 U/L — ABNORMAL LOW (ref 38–126)
Anion gap: 8 (ref 5–15)
BUN: 8 mg/dL (ref 6–20)
CO2: 23 mmol/L (ref 22–32)
Calcium: 9 mg/dL (ref 8.9–10.3)
Chloride: 103 mmol/L (ref 98–111)
Creatinine: 0.55 mg/dL (ref 0.44–1.00)
GFR, Estimated: >60 mL/min (ref >60)
Glucose, Bld: 86 mg/dL (ref 70–99)
Potassium: 4.2 mmol/L (ref 3.5–5.1)
Sodium: 134 mmol/L — ABNORMAL LOW (ref 135–145)
Total Bilirubin: 0.4 mg/dL (ref 0.0–1.2)
Total Protein: 7.1 g/dL (ref 6.5–8.1)

## 2023-09-15 LAB — FERRITIN: Ferritin: 310 ng/mL — ABNORMAL HIGH (ref 11–307)

## 2023-09-24 ENCOUNTER — Ambulatory Visit: Payer: BC Managed Care – PPO | Attending: Internal Medicine | Admitting: Internal Medicine

## 2023-09-24 VITALS — BP 90/60 | HR 87 | Ht 62.5 in

## 2023-09-24 DIAGNOSIS — R42 Dizziness and giddiness: Secondary | ICD-10-CM | POA: Diagnosis not present

## 2023-09-24 DIAGNOSIS — I774 Celiac artery compression syndrome: Secondary | ICD-10-CM | POA: Diagnosis not present

## 2023-09-24 NOTE — Progress Notes (Signed)
 Patient Care Team: Cyndia Diver, PA-C as PCP - General (Family Medicine) Michaelyn Barter, MD as Consulting Physician (Oncology)   HPI  Sydney Reyes is a 23 y.o. 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  Seen initially 12/22 with recommendations of salt supplementation and compression  Fluid intake is replete; she has struggled however with sodium.  She does not mostly by diet has not tolerated oral potassium supplements.  She ended up getting started on metoprolol (Dr. CE)--no clinical improvement  The patient denies, nocturnal dyspnea, orthopnea or peripheral edema.  There have been no palpitations or syncope.  Complains of ongoing issues with lightheadedness which have been variable.  Also with shortness of breath with exertion and various different chest discomforts.  She has been in the emergency room and discharged without anything significant being identified. She also complains of postprandial pain, nausea vomiting accompanied by a 15 pound weight loss which is about 12-13%. .   Date Cr K Hgb  10/22 0.7 4.5 14.9   8/24   4.0          Records and Results Reviewed   Past Medical History:  Diagnosis Date   ADHD    Anxiety    Asthma    Avoidant/restrictive food intake disorder    Dysautonomia (HCC)    Generalized hypermobility of joints    OCD (obsessive compulsive disorder)     Past Surgical History:  Procedure Laterality Date   WISDOM TOOTH EXTRACTION      Current Meds  Medication Sig   albuterol (VENTOLIN HFA) 108 (90 Base) MCG/ACT inhaler SMARTSIG:2 Puff(s) By Mouth Every 6 Hours PRN   ALPRAZolam (XANAX) 0.5 MG tablet Take 1 tablet (0.5 mg total) by mouth as directed. Takes it once a month as needed for severe anxiety attacks   cyanocobalamin 1000 MCG tablet Take 1,000 mcg by  mouth daily.   escitalopram (LEXAPRO) 20 MG tablet Take 1 tablet (20 mg total) by mouth daily.   fludrocortisone (FLORINEF) 0.1 MG tablet Take 1 tablet (0.1 mg) by mouth twice daily   loratadine (CLARITIN) 10 MG tablet Take 10 mg by mouth daily.   magnesium 30 MG tablet Take 30 mg by mouth daily.   Melatonin 10 MG TABS Take 1 tablet by mouth at bedtime as needed.   montelukast (SINGULAIR) 10 MG tablet Take 10 mg by mouth daily.   norethindrone-ethinyl estradiol-FE (LOESTRIN FE) 1-20 MG-MCG tablet Take 1 tablet by mouth daily.   ondansetron (ZOFRAN-ODT) 4 MG disintegrating tablet Take 4 mg by mouth every 8 (eight) hours as needed.   pantoprazole (PROTONIX) 40 MG tablet Take 40 mg by mouth daily.   traZODone (DESYREL) 50 MG tablet Take 1-3 tablets (50-150 mg total) by mouth at bedtime.    Allergies  Allergen Reactions   Prednisone Other (See Comments)    Dizziness/fainting   Gluten Meal Other (See Comments)      Review of Systems negative except from HPI and PMH  Physical Exam BP 90/60 (BP Location: Left Arm, Patient Position: Sitting, Cuff Size: Normal)   Ht 5' 2.5" (1.588 m)   BMI 20.52 kg/m  Well developed and nourished in no acute distress HENT normal Neck supple with JVP-  flat  Clear Regular rate and rhythm, no murmurs or gallops Abd-soft with active BS No Clubbing cyanosis edema Skin-warm and dry A &  Oriented  Grossly normal sensory and motor function  ECG sinus @ 87 07/06/35    CrCl cannot be calculated (Unknown ideal weight.).   Assessment and  Plan Dysautonomia with orthostatic tachycardia and low blood pressure  Joint hypermobility/Ehlers-Danlos  Hemochromatosis?  Postprandial nausea vomiting   GI symptoms have been the dominant problem of weight.  She brings information regarding median arcuate ligament syndrome doing some literature suggest that there is a high correlation between it EDS and POTS.  Will undertake an MRI  With her elevated ferritin  repeatedly, MRI imaging will also include her liver and T2 imaging to be helpful in informing of this diagnosis  Otherwise, we will continue salt and water.  And compression  For her ADHD, reviewing psychiatry notes today, perhaps modafinil could be considered this is been helpful both for ADHD as well as for POTS

## 2023-09-24 NOTE — Patient Instructions (Signed)
 Medication Instructions:  The current medical regimen is effective;  continue present plan and medications.  *If you need a refill on your cardiac medications before your next appointment, please call your pharmacy*   Testing/Procedures: Abdominal MRI- we will call you to set this up.   Follow-Up: At [facility], you and your health needs are our priority.  As part of our continuing mission to provide you with exceptional heart care, we have created designated Provider Care Teams.  These Care Teams include your primary Cardiologist (physician) and Advanced Practice Providers (APPs -  Physician Assistants and Nurse Practitioners) who all work together to provide you with the care you need, when you need it.  We recommend signing up for the patient portal called "MyChart".  Sign up information is provided on this After Visit Summary.  MyChart is used to connect with patients for Virtual Visits (Telemedicine).  Patients are able to view lab/test results, encounter notes, upcoming appointments, etc.  Non-urgent messages can be sent to your provider as well.   To learn more about what you can do with MyChart, go to [email or web address].    Your next appointment:   As needed  Provider:   Sherryl Manges, MD

## 2023-10-01 ENCOUNTER — Telehealth: Payer: Self-pay | Admitting: Internal Medicine

## 2023-10-01 NOTE — Telephone Encounter (Signed)
 New Message:       Patient wanted to know if Dr Graciela Husbands wanted her to do any other tests besides the MRI?

## 2023-10-01 NOTE — Telephone Encounter (Signed)
 Spoke with patient and she would like to know what other test you recommend besides MRI? She is aware Dr. Graciela Husbands and his nurse is out of office today and will respond once he is back in office.

## 2023-10-02 ENCOUNTER — Ambulatory Visit: Payer: Self-pay

## 2023-10-02 DIAGNOSIS — R634 Abnormal weight loss: Secondary | ICD-10-CM | POA: Diagnosis not present

## 2023-10-02 DIAGNOSIS — R1013 Epigastric pain: Secondary | ICD-10-CM | POA: Diagnosis present

## 2023-10-02 DIAGNOSIS — K219 Gastro-esophageal reflux disease without esophagitis: Secondary | ICD-10-CM | POA: Diagnosis not present

## 2023-10-02 DIAGNOSIS — K2970 Gastritis, unspecified, without bleeding: Secondary | ICD-10-CM | POA: Diagnosis not present

## 2023-10-02 DIAGNOSIS — R194 Change in bowel habit: Secondary | ICD-10-CM | POA: Diagnosis not present

## 2023-10-07 ENCOUNTER — Telehealth: Payer: Self-pay

## 2023-10-07 DIAGNOSIS — F633 Trichotillomania: Secondary | ICD-10-CM

## 2023-10-07 DIAGNOSIS — F341 Dysthymic disorder: Secondary | ICD-10-CM

## 2023-10-07 DIAGNOSIS — F411 Generalized anxiety disorder: Secondary | ICD-10-CM

## 2023-10-07 DIAGNOSIS — F429 Obsessive-compulsive disorder, unspecified: Secondary | ICD-10-CM

## 2023-10-07 DIAGNOSIS — R110 Nausea: Secondary | ICD-10-CM

## 2023-10-07 DIAGNOSIS — F410 Panic disorder [episodic paroxysmal anxiety] without agoraphobia: Secondary | ICD-10-CM

## 2023-10-07 MED ORDER — ESCITALOPRAM OXALATE 20 MG PO TABS: 20.0000 mg | ORAL_TABLET | Freq: Every day | ORAL | 1 refills | Status: DC

## 2023-10-07 NOTE — Telephone Encounter (Signed)
 Pt returned my call to discuss MRI. Pt sees Dr. Graciela Husbands, however DOD had to sign a new order for her MRI tomorrow that was with/without contrast instead of with. DOD had asked for pt to be called to ensure she has no metal inside. Pt stated she does not. Will let DOD know. Pt also stated that Dr. Graciela Husbands said he would look into what other tests to try, including a cardiac CT but had not heard from anyone. Pt was told that her questions would be forwarded to Dr. Graciela Husbands and his nurse.

## 2023-10-07 NOTE — Addendum Note (Signed)
 Addended byJomarie Longs on: 10/07/2023 02:43 PM   Modules accepted: Orders

## 2023-10-07 NOTE — Telephone Encounter (Signed)
 received fax requesting a refill on the escitalopram pt was last seen on 2-6 next appt 4-7

## 2023-10-07 NOTE — Telephone Encounter (Signed)
 Pt.notified

## 2023-10-07 NOTE — Telephone Encounter (Signed)
 Received call from POC team. Was told that the "doctor's office" called to say that the MRI order was put in incorrectly. The current order is for with contrast and they need it to be with/without contrast. Took this to DOD as Graciela Husbands is on vacation. DOD asked for pt to be called to make sure they have no metal. LVMTCB 3/12. A new order for MRI with/without contrast was put in and sent to provider.

## 2023-10-07 NOTE — Telephone Encounter (Signed)
 I have sent Lexapro to pharmacy.

## 2023-10-08 ENCOUNTER — Ambulatory Visit: Admission: RE | Admit: 2023-10-08 | Source: Ambulatory Visit

## 2023-10-08 ENCOUNTER — Ambulatory Visit
Admission: RE | Admit: 2023-10-08 | Discharge: 2023-10-08 | Disposition: A | Discharge status: Home / Self Care | Source: Ambulatory Visit | Attending: Cardiology | Admitting: Cardiology

## 2023-10-08 ENCOUNTER — Other Ambulatory Visit: Payer: Self-pay | Admitting: Internal Medicine

## 2023-10-08 DIAGNOSIS — R110 Nausea: Secondary | ICD-10-CM | POA: Insufficient documentation

## 2023-10-08 MED ORDER — GADOBUTROL 1 MMOL/ML IV SOLN
5.0000 mL | Freq: Once | INTRAVENOUS | Status: AC | PRN
  Administered 2023-10-08: 5 mL via INTRAVENOUS

## 2023-10-10 NOTE — Telephone Encounter (Signed)
 Please Inform Patient Not intnending other testing at this time Thanks

## 2023-10-12 ENCOUNTER — Telehealth: Payer: Self-pay | Admitting: Internal Medicine

## 2023-10-12 NOTE — Telephone Encounter (Signed)
 Spoke with pt, aware Dr Graciela Husbands has not reviewed MRI. Aware will forward note to him.

## 2023-10-12 NOTE — Telephone Encounter (Signed)
 Patient stated she was returning RN's call regarding test results.

## 2023-10-12 NOTE — Telephone Encounter (Signed)
 Left voicemail to return call to office

## 2023-10-13 NOTE — Telephone Encounter (Signed)
 See previous notes.  No further testing at this time per Dr.Klein. Thanks!

## 2023-10-16 ENCOUNTER — Other Ambulatory Visit: Payer: Self-pay | Admitting: Gastroenterology

## 2023-10-16 DIAGNOSIS — K219 Gastro-esophageal reflux disease without esophagitis: Secondary | ICD-10-CM

## 2023-10-16 DIAGNOSIS — R634 Abnormal weight loss: Secondary | ICD-10-CM

## 2023-10-16 DIAGNOSIS — R1013 Epigastric pain: Secondary | ICD-10-CM

## 2023-10-30 ENCOUNTER — Telehealth: Payer: Self-pay | Admitting: Internal Medicine

## 2023-10-30 ENCOUNTER — Ambulatory Visit
Admission: RE | Admit: 2023-10-30 | Discharge: 2023-10-30 | Disposition: A | Discharge status: Home / Self Care | Source: Ambulatory Visit | Attending: Gastroenterology | Admitting: Gastroenterology

## 2023-10-30 DIAGNOSIS — K219 Gastro-esophageal reflux disease without esophagitis: Secondary | ICD-10-CM | POA: Diagnosis present

## 2023-10-30 DIAGNOSIS — R1013 Epigastric pain: Secondary | ICD-10-CM | POA: Diagnosis present

## 2023-10-30 DIAGNOSIS — R634 Abnormal weight loss: Secondary | ICD-10-CM | POA: Diagnosis present

## 2023-10-30 MED ORDER — TECHNETIUM TC 99M SULFUR COLLOID
1.7600 | Freq: Once | INTRAVENOUS | Status: AC
  Administered 2023-10-30: 1.76 via ORAL

## 2023-10-30 NOTE — Telephone Encounter (Signed)
 Patient states that PT and dietician recommended that Dr. Graciela Husbands place a vascular referral for an MRA.  She insists that Dr. Graciela Husbands will know why because he is very familiar with her.  I asked her reach out to her PCP for referrals but she reports they thought he could help her get an appointment sooner because she graduates in 50 days.

## 2023-10-30 NOTE — Telephone Encounter (Signed)
 Patient calling to see if Dr. Graciela Husbands and send her a referral to a vascular dr. Please advise

## 2023-11-02 ENCOUNTER — Telehealth (INDEPENDENT_AMBULATORY_CARE_PROVIDER_SITE_OTHER): Payer: BC Managed Care – PPO | Admitting: Psychiatry

## 2023-11-02 ENCOUNTER — Encounter: Payer: Self-pay | Admitting: Internal Medicine

## 2023-11-02 ENCOUNTER — Encounter: Payer: Self-pay | Admitting: Psychiatry

## 2023-11-02 DIAGNOSIS — R110 Nausea: Secondary | ICD-10-CM

## 2023-11-02 DIAGNOSIS — F633 Trichotillomania: Secondary | ICD-10-CM | POA: Diagnosis not present

## 2023-11-02 DIAGNOSIS — F411 Generalized anxiety disorder: Secondary | ICD-10-CM | POA: Diagnosis not present

## 2023-11-02 DIAGNOSIS — Z8659 Personal history of other mental and behavioral disorders: Secondary | ICD-10-CM

## 2023-11-02 DIAGNOSIS — F429 Obsessive-compulsive disorder, unspecified: Secondary | ICD-10-CM

## 2023-11-02 DIAGNOSIS — F410 Panic disorder [episodic paroxysmal anxiety] without agoraphobia: Secondary | ICD-10-CM

## 2023-11-02 DIAGNOSIS — F341 Dysthymic disorder: Secondary | ICD-10-CM | POA: Diagnosis not present

## 2023-11-02 MED ORDER — ESCITALOPRAM OXALATE 20 MG PO TABS: 20.0000 mg | ORAL_TABLET | Freq: Every day | ORAL | 1 refills | Status: AC

## 2023-11-02 NOTE — Progress Notes (Signed)
 Virtual Visit via Video Note  I connected with Sydney Reyes on 11/02/23 at  1:00 PM EDT by a video enabled telemedicine application and verified that I am speaking with the correct person using two identifiers.  Location Provider Location : [facility] Patient Location : Home  Participants: Patient , Provider    I discussed the limitations of evaluation and management by telemedicine and the availability of in person appointments. The patient expressed understanding and agreed to proceed.   I discussed the assessment and treatment plan with the patient. The patient was provided an opportunity to ask questions and all were answered. The patient agreed with the plan and demonstrated an understanding of the instructions.   The patient was advised to call back or seek an in-person evaluation if the symptoms worsen or if the condition fails to improve as anticipated.   [facility] MD OP Progress Note  11/02/2023 1:34 PM Sydney Reyes  MRN:  [id number]  Chief Complaint:  Chief Complaint  Patient presents with   Follow-up   Anxiety   Depression   Medication Refill  Discussed the use of AI scribe software for clinical note transcription with the patient, who gave verbal consent to proceed. History of Present Illness Sydney Reyes is a 23 year old Caucasian female, lives in [place], consulting civil engineer, has a history of GAD, OCD, persistent depressive disorder, trichotillomania, history of ADHD, panic attacks was evaluated by telemedicine today.  She has a history of generalized anxiety, OCD, persistent depression, and trichotillomania. She is currently on Lexapro  20 mg, which has been effective in managing her anxiety. She uses Xanax  rarely, with the last use in January, and takes trazodone  for sleep. No recent panic attacks, significant depression symptoms, or thoughts of self-harm. She manages OCD and anxiety with coping strategies and therapy.  She has been experiencing gastrointestinal issues since July of the  previous year, characterized by early satiety and nausea. She describes getting full quickly and experiencing stomach pain when eating. Despite being on a daily medication to settle her stomach, she continues to experience some nausea and early fullness. She has undergone extensive diagnostic workup including an endoscopy, MRI, and a gastric emptying study, which did not confirm gastroparesis. Despite these tests, the cause of her symptoms remains unidentified.  She has experienced weight fluctuations, initially losing 15 pounds and then regaining 5 pounds, currently weighing 108 pounds. She maintains a food journal and works with a data processing manager, confirming she is consuming enough calories to gain weight, yet she is not gaining as expected.  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 She is managing her academic responsibilities well, maintaining good grades. She plans to move back to [place]  after graduation.    Visit Diagnosis:    ICD-10-CM   1. GAD (generalized anxiety disorder)  F41.1 escitalopram  (LEXAPRO ) 20 MG tablet    2. 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 Panic attacks  F41.0 escitalopram  (LEXAPRO ) 20 MG tablet    6. History of ADHD  Z86.59       Past Psychiatric History: I have reviewed past psychiatric history from progress note on 05/28/2023.  Past trials of medications like Pristiq, Strattera, Wellbutrin, fluvoxamine, Adderall, BuSpar  and several others.  Past Medical History:  Past Medical History:  Diagnosis Date   ADHD    Anxiety    Asthma    Avoidant/restrictive food intake disorder    Dysautonomia (HCC)    Generalized hypermobility of joints    OCD (obsessive compulsive disorder)     Past Surgical History:  Procedure  Laterality Date   WISDOM TOOTH EXTRACTION      Family Psychiatric History: I have reviewed family psychiatric history from progress note on 05/28/2023.  Family History:  Family History  Problem Relation Age of Onset   ADD / ADHD Father    Anxiety disorder Father    Depression Father    Drug abuse Maternal Grandfather    Ovarian cysts Maternal Grandmother    Stroke Paternal Grandfather    Dementia Paternal Great-grandfather    Cancer Paternal Great-grandfather    Breast cancer Maternal Great-grandfather    Heart attack Maternal Great-grandfather    Sudden Cardiac Death Neg Hx     Social History: I have reviewed social history from progress note on 05/28/2023. Social History   Socioeconomic History   Marital status: Single    Spouse name: Not on file   Number of children: Not on file   Years of education: Not on file   Highest education level: Some college, no degree  Occupational History   Not on file  Tobacco Use   Smoking status: Never   Smokeless tobacco: Never  Vaping Use   Vaping status: Never Used  Substance and Sexual Activity   Alcohol use: Yes    Comment: rarely   Drug use: No    Comment: uses CBD Gummies   Sexual activity: Yes    Birth control/protection: None  Other Topics Concern   Not on file  Social History Narrative   ** Merged History Encounter **       Social Drivers of Health   Financial Resource Strain: Low Risk  (09/10/2023)   Received from [facility]   Overall Financial Resource Strain (CARDIA)    Difficulty of Paying Living Expenses: Not very hard  Food Insecurity: No Food Insecurity (09/10/2023)   Received from [facility]   Hunger Vital Sign    Worried About Running Out of Food in the Last Year: Never true    Ran Out of Food in the Last Year: Never true  Transportation Needs: No Transportation Needs (09/10/2023)   Received from [facility] - Transportation    In the  past 12 months, has lack of transportation kept you from medical appointments or from getting medications?: No    Lack of Transportation (Non-Medical): No  Physical Activity: Sufficiently Active (03/20/2021)   Received from [facility]   Exercise Vital Sign    Days of Exercise per Week: 5 days    Minutes of Exercise per Session: 80 min  Stress: Stress Concern Present (03/20/2021)   Received from [facility], [facility] - Occupational Stress Questionnaire    Feeling of Stress : Very much  Social Connections: Unknown (12/02/2021)   Received from [facility], [facility]   Social Network    Social Network: Not on file    Allergies:  Allergies  Allergen Reactions   Prednisone Other (See Comments)    Dizziness/fainting   Gluten Meal Other (See Comments)    Metabolic Disorder Labs: No results found for: HGBA1C, MPG No results found for: PROLACTIN No results found for: CHOL, TRIG, HDL, CHOLHDL, VLDL,  LDLCALC No results found for: TSH  Therapeutic Level Labs: No results found for: LITHIUM No results found for: VALPROATE No results found for: CBMZ  Current Medications: Current Outpatient Medications  Medication Sig Dispense Refill   albuterol (VENTOLIN HFA) 108 (90 Base) MCG/ACT inhaler SMARTSIG:2 Puff(s) By Mouth Every 6 Hours PRN     ALPRAZolam  (XANAX ) 0.5 MG tablet Take 1 tablet (0.5 mg total) by mouth as directed. Takes it once a month as needed for severe anxiety attacks 10 tablet 0   fludrocortisone  (FLORINEF ) 0.1 MG tablet Take 1 tablet (0.1 mg) by mouth twice daily (Patient taking differently: Take 0.1 mg by mouth daily. Take 1 tablet (0.1 mg) by mouth daily) 180 tablet 1   loratadine (CLARITIN) 10 MG tablet Take 10 mg by mouth daily.     magnesium 30 MG tablet Take 30 mg by mouth daily.     Melatonin 10 MG TABS Take 1 tablet by mouth at bedtime as needed.     montelukast (SINGULAIR)  10 MG tablet Take 10 mg by mouth daily.     norethindrone-ethinyl estradiol-FE (LOESTRIN FE) 1-20 MG-MCG tablet Take 1 tablet by mouth daily.     ondansetron (ZOFRAN-ODT) 4 MG disintegrating tablet Take 4 mg by mouth every 8 (eight) hours as needed.     pantoprazole (PROTONIX) 40 MG tablet Take 40 mg by mouth daily.     traZODone  (DESYREL ) 50 MG tablet Take 1-3 tablets (50-150 mg total) by mouth at bedtime. 270 tablet 1   escitalopram  (LEXAPRO ) 20 MG tablet Take 1 tablet (20 mg total) by mouth daily. 90 tablet 1   No current facility-administered medications for this visit.     Musculoskeletal: Strength & Muscle Tone:  UTA Gait & Station:  Seated Patient leans: N/A  Psychiatric Specialty Exam: Review of Systems  Psychiatric/Behavioral:  Positive for sleep disturbance (varies).     Last menstrual period 08/17/2023.There is no height or weight on file to calculate BMI.  General Appearance: Casual  Eye Contact:  Fair  Speech:  Clear and Coherent  Volume:  Normal  Mood:  Euthymic  Affect:  Congruent  Thought Process:  Goal Directed and Descriptions of Associations: Intact  Orientation:  Full (Time, Place, and Person)  Thought Content: Logical   Suicidal Thoughts:  No  Homicidal Thoughts:  No  Memory:  Immediate;   Fair Recent;   Fair Remote;   Fair  Judgement:  Fair  Insight:  Fair  Psychomotor Activity:  Normal  Concentration:  Concentration: Fair and Attention Span: Fair  Recall:  [employer] of Knowledge: Fair  Language: Fair  Akathisia:  No  Handed:  Right  AIMS (if indicated): not done  Assets:  Desire for Improvement Housing Social Support Transportation  ADL's:  Intact  Cognition: WNL  Sleep:   Varies    Screenings: GAD-7    Garment/textile Technologist Visit from 05/28/2023 in [facility]  Total GAD-7 Score 21      PHQ2-9    Flowsheet Row Video Visit from 11/02/2023 in [facility]  Video Visit from 06/24/2023 in [facility] Office Visit from 05/28/2023 in [facility]  PHQ-2 Total Score 0 4 4  PHQ-9 Total Score -- 10 20      Flowsheet Row Video Visit from 11/02/2023 in [facility] Video Visit from 09/03/2023 in [facility] Video Visit  from 06/24/2023 in [facility]  C-SSRS RISK CATEGORY No Risk No Risk No Risk        Assessment and Plan: Michille Mcelrath is a 23 year old Caucasian female with a history of generalized anxiety disorder, depression, OCD, trichotillomania, was evaluated by telemedicine today.  Discussed assessment and plan as noted below. Assessment & Plan Generalized Anxiety Disorder/Panic attacks- Stable Generalized anxiety disorder managed with Lexapro  20 mg, effective in controlling anxiety symptoms. Occasional severe anxiety episodes managed with coping strategies and Xanax  as needed. No recent panic attacks. She is aware of the high-risk medication use label due to Xanax  and its implications for insurance billing, expressing concerns about its impact on her care. - Continue Lexapro  20 mg daily - Switch Lexapro  prescription to a 90-day supply with a refill - Use Xanax  0.5 mg as needed for severe anxiety episodes, short supply was previously provided for panic attacks. - Continue therapy sessions every other week - Utilize coping strategies for anxiety management  Obsessive-Compulsive Disorder (OCD)-stable OCD symptoms related to contamination fears and excessive cleaning are present but not significantly impairing daily functioning. Managed with therapy and coping strategies. - Continue therapy sessions every other week - Utilize coping strategies for OCD management - Continue Lexapro  20 mg daily  Persistent Depressive Disorder (Dysthymia) in  remission No significant depressive symptoms reported in the past two weeks. Lexapro  appears effective in managing mood symptoms. - Continue Lexapro  20 mg daily - Continue Trazodone  50 mg, take 1 to 3 tablets at bedtime.  Trichotillomania-stable Trichotillomania symptoms are well-managed with occasional episodes of tweezing, not concerning at this time. - Utilize coping strategies as needed - Continue psychotherapy sessions  Gastrointestinal symptoms Chronic gastrointestinal symptoms since July of last year, including early satiety, nausea, and abdominal pain. Extensive workup including endoscopy, MRI, and gastric emptying study did not confirm gastroparesis. Weight loss of approximately 10 pounds from baseline, currently at 108 pounds. Symptoms managed with daily Protonix and dietary adjustments with dietitian support. Unclear etiology of symptoms, ongoing symptom management. She is concerned about the impact of a high-risk medication use label on her care, leading to misunderstandings with other healthcare providers. - Continue Protonix daily - Maintain dietary adjustments with dietitian support - Monitor weight and nutritional intake - ' High risk medication use diagnosis code changed to resolved' per patient request.  Follow-up - Follow-up in clinic as needed.   Patient/Guardian was advised Release of Information must be obtained prior to any record release in order to collaborate their care with an outside provider. Patient/Guardian was advised if they have not already done so to contact the registration department to sign all necessary forms in order for us  to release information regarding their care.   Consent: Patient/Guardian gives verbal consent for treatment and assignment of benefits for services provided during this visit. Patient/Guardian expressed understanding and agreed to proceed.  This note was generated in part or whole with voice recognition software. Voice recognition is  usually quite accurate but there are transcription errors that can and very often do occur. I apologize for any typographical errors that were not detected and corrected.     Tiyona Desouza, MD 11/03/2023, 6:05 PM

## 2023-11-04 NOTE — Telephone Encounter (Signed)
 Please Inform Patient Always good to have another set of eyes and ears If she has predominant gi symptoms maybe we could get her in with Dr Demetria Pore at [facility]   I think you have her contact number Thanks

## 2023-11-30 ENCOUNTER — Inpatient Hospital Stay: Payer: BC Managed Care – PPO | Attending: Internal Medicine

## 2023-11-30 DIAGNOSIS — R7989 Other specified abnormal findings of blood chemistry: Secondary | ICD-10-CM | POA: Diagnosis present

## 2023-11-30 LAB — CBC WITH DIFFERENTIAL/PLATELET
Abs Immature Granulocytes: 0.02 10*3/uL (ref 0.00–0.07)
Basophils Absolute: 0 10*3/uL (ref 0.0–0.1)
Basophils Relative: 1 %
Eosinophils Absolute: 0.3 10*3/uL (ref 0.0–0.5)
Eosinophils Relative: 5 %
HCT: 38.8 % (ref 36.0–46.0)
Hemoglobin: 13.5 g/dL (ref 12.0–15.0)
Immature Granulocytes: 0 %
Lymphocytes Relative: 43 %
Lymphs Abs: 2.4 10*3/uL (ref 0.7–4.0)
MCH: 31.7 pg (ref 26.0–34.0)
MCHC: 34.8 g/dL (ref 30.0–36.0)
MCV: 91.1 fL (ref 80.0–100.0)
Monocytes Absolute: 0.3 10*3/uL (ref 0.1–1.0)
Monocytes Relative: 5 %
Neutro Abs: 2.5 10*3/uL (ref 1.7–7.7)
Neutrophils Relative %: 46 %
Platelets: 264 10*3/uL (ref 150–400)
RBC: 4.26 MIL/uL (ref 3.87–5.11)
RDW: 11.9 % (ref 11.5–15.5)
WBC: 5.5 10*3/uL (ref 4.0–10.5)
nRBC: 0 % (ref 0.0–0.2)

## 2023-11-30 LAB — IRON AND TIBC
Iron: 300 ug/dL — ABNORMAL HIGH (ref 28–170)
Saturation Ratios: 78 % — ABNORMAL HIGH (ref 10.4–31.8)
TIBC: 386 ug/dL (ref 250–450)
UIBC: 86 ug/dL

## 2023-11-30 LAB — CMP (CANCER CENTER ONLY)
ALT: 12 U/L (ref 0–44)
AST: 16 U/L (ref 15–41)
Albumin: 3.9 g/dL (ref 3.5–5.0)
Alkaline Phosphatase: 32 U/L — ABNORMAL LOW (ref 38–126)
Anion gap: 9 (ref 5–15)
BUN: 9 mg/dL (ref 6–20)
CO2: 21 mmol/L — ABNORMAL LOW (ref 22–32)
Calcium: 8.8 mg/dL — ABNORMAL LOW (ref 8.9–10.3)
Chloride: 106 mmol/L (ref 98–111)
Creatinine: 0.68 mg/dL (ref 0.44–1.00)
GFR, Estimated: >60 mL/min (ref >60)
Glucose, Bld: 106 mg/dL — ABNORMAL HIGH (ref 70–99)
Potassium: 3.5 mmol/L (ref 3.5–5.1)
Sodium: 136 mmol/L (ref 135–145)
Total Bilirubin: 0.7 mg/dL (ref 0.0–1.2)
Total Protein: 7.4 g/dL (ref 6.5–8.1)

## 2023-11-30 LAB — FERRITIN: Ferritin: 359 ng/mL — ABNORMAL HIGH (ref 11–307)

## 2023-12-03 ENCOUNTER — Encounter: Payer: Self-pay | Admitting: Internal Medicine

## 2023-12-03 ENCOUNTER — Inpatient Hospital Stay: Payer: BC Managed Care – PPO | Admitting: Internal Medicine

## 2023-12-03 NOTE — Progress Notes (Signed)
 [facility]  Telephone:([phone] [phone]  ID: Sydney Reyes OB: October 06, 2000  MR#: [id number]  AOZ#:[id number]  Patient Care Team: Bounvilay, Celena, PA-C as PCP - General (Family Medicine) Montae Stager, MD as Consulting Physician (Oncology)  REASON FOR REFERRAL: elevated ferritin  HPI: Sydney Reyes is a 22 y.o. female with past medical history of dysautonomia on salt tablet and fludrocortisone , Ehlers-Danlos syndrome, anxiety, ADHD was referred to hematology for elevated ferritin.  Patient reports about 2 years ago she had her ferritin level checked which was elevated to 800 with saturation about 60%.  At that time, had Maryl test which showed abnormal copies for hemochromatosis.   Had repeat levels on 03/23/2023.  Iron 259, saturation 67%, ferritin 382.  WBC 6.9, hemoglobin 14.2 and platelet 308.  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  Patient reports her mother has anemia and father went for iron level testing which came within the normal limit.  Reports history of heart failure in paternal grandfather in 2000.  Of unknown etiology.   Interval history Patient was seen today as follow-up to discuss labs Since the last visit, patient had workup for intermittent nausea, left-sided abdominal pain.  No etiology could be found.  She had been asked to see EDS specialist to further evaluate.  She is graduating in couple of weeks and will be moving back home to [place] . REVIEW OF SYSTEMS:   ROS  As per HPI. Otherwise, a complete review of systems is negative.  PAST MEDICAL HISTORY: Past Medical History:  Diagnosis Date   ADHD    Anxiety    Asthma    Avoidant/restrictive food intake disorder    Dysautonomia (HCC)    Generalized hypermobility of joints    OCD (obsessive compulsive disorder)     PAST SURGICAL HISTORY: Past Surgical History:  Procedure Laterality Date   WISDOM TOOTH EXTRACTION      FAMILY HISTORY: Family  History  Problem Relation Age of Onset   ADD / ADHD Father    Anxiety disorder Father    Depression Father    Drug abuse Maternal Grandfather    Ovarian cysts Maternal Grandmother    Stroke Paternal Grandfather    Dementia Paternal Great-grandfather    Cancer Paternal Great-grandfather    Breast cancer Maternal Great-grandfather    Heart attack Maternal Great-grandfather    Sudden Cardiac Death Neg Hx     HEALTH MAINTENANCE: Social History   Tobacco Use   Smoking status: Never   Smokeless tobacco: Never  Vaping Use   Vaping status: Never Used  Substance Use Topics   Alcohol use: Yes    Comment: rarely   Drug use: No    Comment: uses CBD Gummies     Allergies  Allergen Reactions   Prednisone Other (See Comments)    Dizziness/fainting   Gluten Meal Other (See Comments)    Current Outpatient Medications  Medication Sig Dispense Refill   albuterol (VENTOLIN HFA) 108 (90 Base) MCG/ACT inhaler SMARTSIG:2 Puff(s) By Mouth Every 6 Hours PRN     ALPRAZolam  (XANAX ) 0.5 MG tablet Take 1 tablet (0.5 mg total) by mouth as directed. Takes it once a month as needed for severe anxiety attacks 10 tablet 0   escitalopram  (LEXAPRO ) 20 MG tablet Take 1 tablet (20 mg total) by mouth daily. 90 tablet 1   fludrocortisone  (FLORINEF ) 0.1 MG tablet Take 1 tablet (0.1 mg) by mouth twice daily (Patient taking differently: Take 0.1 mg by mouth daily. Take 1 tablet (0.1  mg) by mouth daily) 180 tablet 1   loratadine (CLARITIN) 10 MG tablet Take 10 mg by mouth daily.     magnesium 30 MG tablet Take 30 mg by mouth daily.     Melatonin 10 MG TABS Take 1 tablet by mouth at bedtime as needed.     montelukast (SINGULAIR) 10 MG tablet Take 10 mg by mouth daily.     norethindrone-ethinyl estradiol-FE (LOESTRIN FE) 1-20 MG-MCG tablet Take 1 tablet by mouth daily.     ondansetron (ZOFRAN-ODT) 4 MG disintegrating tablet Take 4 mg by mouth every 8 (eight) hours as needed.     pantoprazole (PROTONIX) 40 MG  tablet Take 40 mg by mouth daily.     traZODone  (DESYREL ) 50 MG tablet Take 1-3 tablets (50-150 mg total) by mouth at bedtime. 270 tablet 1   No current facility-administered medications for this visit.    OBJECTIVE: Vitals:   12/03/23 1456  BP: 94/66  Pulse: (!) 115  Resp: 18  Temp: 98.5 F (36.9 C)  SpO2: 100%     Body mass index is 18.72 kg/m.      General: Well-developed, well-nourished, no acute distress. Eyes: Pink conjunctiva, anicteric sclera. HEENT: Normocephalic, moist mucous membranes, clear oropharnyx. Lungs: Clear to auscultation bilaterally. Heart: Regular rate and rhythm. No rubs, murmurs, or gallops. Abdomen: Soft, nontender, nondistended. No organomegaly noted, normoactive bowel sounds. Musculoskeletal: No edema, cyanosis, or clubbing. Neuro: Alert, answering all questions appropriately. Cranial nerves grossly intact. Skin: No rashes or petechiae noted. Psych: Normal affect. Lymphatics: No cervical, calvicular, axillary or inguinal LAD.   LAB RESULTS:  Lab Results  Component Value Date   NA 136 11/30/2023   K 3.5 11/30/2023   CL 106 11/30/2023   CO2 21 (L) 11/30/2023   GLUCOSE 106 (H) 11/30/2023   BUN 9 11/30/2023   CREATININE 0.68 11/30/2023   CALCIUM 8.8 (L) 11/30/2023   PROT 7.4 11/30/2023   ALBUMIN 3.9 11/30/2023   AST 16 11/30/2023   ALT 12 11/30/2023   ALKPHOS 32 (L) 11/30/2023   BILITOT 0.7 11/30/2023   GFRNONAA >60 11/30/2023    Lab Results  Component Value Date   WBC 5.5 11/30/2023   NEUTROABS 2.5 11/30/2023   HGB 13.5 11/30/2023   HCT 38.8 11/30/2023   MCV 91.1 11/30/2023   PLT 264 11/30/2023    Lab Results  Component Value Date   TIBC 386 11/30/2023   TIBC 434 09/15/2023   TIBC 399 06/11/2023   FERRITIN 359 (H) 11/30/2023   FERRITIN 310 (H) 09/15/2023   FERRITIN 330 (H) 06/11/2023   IRONPCTSAT 78 (H) 11/30/2023   IRONPCTSAT 55 (H) 09/15/2023   IRONPCTSAT 79 (H) 06/11/2023     STUDIES: No results  found.  ASSESSMENT AND PLAN:   Sydney Reyes is a 23 y.o. female with pmh of dysautonomia on salt tablet and fludrocortisone , Ehlers-Danlos syndrome, anxiety, ADHD was referred to hematology for elevated ferritin.  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  WBC 6.9, hemoglobin 14.2 and platelet 308. -Ultrasound abdomen was normal.  ESR and CRP normal.  -Gene panel positive for homozygous C282Y mutation consistent with hereditary hemochromatosis.  -Patient is unable to tolerate any phlebotomies with her history of POTS and dysautonomia.    -We discussed in length about alternative options such as iron chelation with Exjade/Jadenu.  Side effects such as liver dysfunction, renal dysfunction, GI symptoms, rash, ophthalmologic evaluation was discussed.  Currently, she is maintaining her ferritin  level between 300-400.  Her mother was tested and had 1 copy of C282Y.  Her father was not tested but likely she got second copy from him.  Sydney Reyes history of any complications.   - Although she has homozygous C282Y mutation, the clinical penetrance can be low. Considering the risk versus benefit of her mildly elevated ferritin and side effects from iron chelation, we agreed that her levels can be closely monitored at this time.  She will go back home to [place]  after 2 weeks and will establish with PCP or hematologist for continued monitoring of iron levels 1-2 times a year.  RTC with us  as needed.  No orders of the defined types were placed in this encounter.  Patient expressed understanding and was in agreement with this plan. She also understands that She can call clinic at any time with any questions, concerns, or complaints.   I spent a total of 15 minutes reviewing chart data, face-to-face evaluation with the patient, counseling and coordination of care as detailed above.  Loreatha Rodney, MD   12/03/2023 3:59 PM

## 2023-12-03 NOTE — Progress Notes (Signed)
 Patient is going back to [place]  come May 28th of this year, she wants to know how often and how close she should be monitored when gets back home?

## 2023-12-08 ENCOUNTER — Other Ambulatory Visit: Payer: Self-pay | Admitting: Gastroenterology

## 2023-12-08 ENCOUNTER — Other Ambulatory Visit: Payer: BC Managed Care – PPO

## 2023-12-08 DIAGNOSIS — R112 Nausea with vomiting, unspecified: Secondary | ICD-10-CM

## 2023-12-08 DIAGNOSIS — K219 Gastro-esophageal reflux disease without esophagitis: Secondary | ICD-10-CM

## 2023-12-08 DIAGNOSIS — R1013 Epigastric pain: Secondary | ICD-10-CM

## 2023-12-08 DIAGNOSIS — R634 Abnormal weight loss: Secondary | ICD-10-CM

## 2023-12-10 ENCOUNTER — Ambulatory Visit: Payer: BC Managed Care – PPO | Admitting: Internal Medicine

## 2023-12-11 ENCOUNTER — Encounter: Payer: Self-pay | Admitting: Internal Medicine

## 2023-12-15 ENCOUNTER — Ambulatory Visit

## 2024-02-19 ENCOUNTER — Other Ambulatory Visit: Payer: Self-pay | Admitting: Psychiatry

## 2024-02-19 DIAGNOSIS — F429 Obsessive-compulsive disorder, unspecified: Secondary | ICD-10-CM

## 2024-02-19 DIAGNOSIS — F341 Dysthymic disorder: Secondary | ICD-10-CM

## 2024-03-19 ENCOUNTER — Other Ambulatory Visit: Payer: Self-pay | Admitting: Psychiatry

## 2024-03-19 DIAGNOSIS — F341 Dysthymic disorder: Secondary | ICD-10-CM

## 2024-03-19 DIAGNOSIS — F429 Obsessive-compulsive disorder, unspecified: Secondary | ICD-10-CM

## 2024-05-07 ENCOUNTER — Other Ambulatory Visit: Payer: Self-pay | Admitting: Psychiatry

## 2024-05-07 DIAGNOSIS — F429 Obsessive-compulsive disorder, unspecified: Secondary | ICD-10-CM

## 2024-05-07 DIAGNOSIS — F32A Depression, unspecified: Secondary | ICD-10-CM

## 2024-05-07 DIAGNOSIS — F411 Generalized anxiety disorder: Secondary | ICD-10-CM

## 2024-05-07 DIAGNOSIS — F633 Trichotillomania: Secondary | ICD-10-CM
